# Patient Record
Sex: Male | Born: 1985 | Race: Black or African American | Hispanic: No | Marital: Single | Smoking: Former smoker
Health system: Southern US, Community
[De-identification: ages and names within clinical notes are randomized; demographics above are authoritative.]

---

## 2007-06-21 ENCOUNTER — Emergency Department: Payer: Self-pay | Admitting: Internal Medicine

## 2014-12-25 ENCOUNTER — Emergency Department (HOSPITAL_COMMUNITY)
Admission: EM | Admit: 2014-12-25 | Discharge: 2014-12-25 | Disposition: A | Discharge status: Home / Self Care | Payer: 59 | Attending: Emergency Medicine | Admitting: Emergency Medicine

## 2014-12-25 ENCOUNTER — Emergency Department (HOSPITAL_COMMUNITY): Payer: 59

## 2014-12-25 ENCOUNTER — Encounter (HOSPITAL_COMMUNITY): Payer: Self-pay | Admitting: *Deleted

## 2014-12-25 DIAGNOSIS — X58XXXA Exposure to other specified factors, initial encounter: Secondary | ICD-10-CM | POA: Insufficient documentation

## 2014-12-25 DIAGNOSIS — Y929 Unspecified place or not applicable: Secondary | ICD-10-CM | POA: Insufficient documentation

## 2014-12-25 DIAGNOSIS — Y998 Other external cause status: Secondary | ICD-10-CM | POA: Diagnosis not present

## 2014-12-25 DIAGNOSIS — Y939 Activity, unspecified: Secondary | ICD-10-CM | POA: Insufficient documentation

## 2014-12-25 DIAGNOSIS — M79671 Pain in right foot: Secondary | ICD-10-CM

## 2014-12-25 DIAGNOSIS — S99921A Unspecified injury of right foot, initial encounter: Secondary | ICD-10-CM | POA: Diagnosis not present

## 2014-12-25 DIAGNOSIS — Z720 Tobacco use: Secondary | ICD-10-CM | POA: Insufficient documentation

## 2014-12-25 MED ORDER — IBUPROFEN 800 MG PO TABS: 800.0000 mg | ORAL_TABLET | Freq: Three times a day (TID) | ORAL | Status: DC

## 2014-12-25 NOTE — ED Provider Notes (Signed)
CSN: [id number]     Arrival date & time 12/25/14  0848 History  This chart was scribed for non-physician practitioner, Santiago Glad, PA-C, working with Richardean Canal, MD, by Ronney Lion, ED Scribe. This patient was seen in room TR08C/TR08C and the patient's care was started at 9:39 AM.    Chief Complaint  Patient presents with  . Foot Pain   The history is provided by the patient. No language interpreter was used.    HPI Comments: Jeremy Curtis is a 29 y.o. male who presents to the Emergency Department complaining of right foot pain since twisting his foot on a rock this morning at 3 AM, about 6 hours ago. He endorses numbness over some of the foot. Patient states he is able to ambulate with a limp. Nothing makes it better. Weight bearing makes it worse. He denies any other injuries. He denies ankle pain.  History reviewed. No pertinent past medical history. History reviewed. No pertinent past surgical history. History reviewed. No pertinent family history. History  Substance Use Topics  . Smoking status: Current Every Day Smoker    Types: Cigarettes  . Smokeless tobacco: Never Used  . Alcohol Use: No    Review of Systems  Musculoskeletal: Positive for myalgias. Negative for arthralgias.      Allergies  Review of patient's allergies indicates not on file.  Home Medications   Prior to Admission medications   Not on File   BP 138/82 mmHg  Pulse 93  Temp(Src) 98.3 F (36.8 C) (Oral)  Resp 18  SpO2 100% Physical Exam  Constitutional: He is oriented to person, place, and time. He appears well-developed and well-nourished. No distress.  HENT:  Head: Normocephalic and atraumatic.  Eyes: Conjunctivae and EOM are normal.  Neck: Neck supple. No tracheal deviation present.  Cardiovascular: Normal rate and regular rhythm.   Pulses:      Dorsalis pedis pulses are 2+ on the right side.  Pulmonary/Chest: Effort normal and breath sounds normal. No respiratory distress.   Musculoskeletal: Normal range of motion. He exhibits tenderness.  TTP to the third metatarsal. No TTP of the right ankle. FROM of the right ankle. Patient able to wiggle his toes.   Neurological: He is alert and oriented to person, place, and time.  Distal sensation of toes intact.  Skin: Skin is warm and dry.  No bruising or erythema of the foot.  Psychiatric: He has a normal mood and affect. His behavior is normal.  Nursing note and vitals reviewed.   ED Course  Procedures (including critical care time)  DIAGNOSTIC STUDIES: Oxygen Saturation is 100% on RA, normal by my interpretation.    COORDINATION OF CARE: 9:41 AM - Pt made aware of XR results. Suspect foot sprain. Discussed treatment plan with pt at bedside which includes RICE protocol, ibuprofen, post-op shoe, and crutches, and pt agreed to plan.  Imaging Review Dg Foot Complete Right  12/25/2014   CLINICAL DATA:  Inversion injury of the right foot with pain medially over the first and second metatarsal regions.  EXAM: RIGHT FOOT COMPLETE - 3+ VIEW  COMPARISON:  None.  FINDINGS: The bones of the right foot are adequately mineralized. There is no acute fracture nor dislocation. The joint spaces are preserved. Specific attention to the first and second metatarsals reveals no acute abnormality. The soft tissues are unremarkable.  IMPRESSION: There is no acute bony abnormality of the right foot.   Electronically Signed   By: David  [place] M.D.  On: 12/25/2014 09:26    MDM   Final diagnoses:  None  Patient presents today with right foot pain after twisting his foot this morning.  No ankle pain.  Xray of foot is negative.  Patient neurovascularly intact.  Stable for discharge.  Return precautions given.  I personally performed the services described in this documentation, which was scribed in my presence. The recorded information has been reviewed and is accurate.     Santiago GladHeather Shawnee Higham, PA-C 12/25/14 1319  Richardean Canalavid H Yao,  MD 12/25/14 [id number]

## 2014-12-25 NOTE — ED Notes (Signed)
Pt reports he turned his rt foot on a rock early this AM. Pt now reports pain to top of foot.

## 2016-04-04 IMAGING — DX DG FOOT COMPLETE 3+V*R*
3 series · 3 of 3 positions shown · non-contrast
Comparison: None.

CLINICAL DATA: Inversion injury of the right foot with pain
medially over the first and second metatarsal regions.

EXAM:
RIGHT FOOT COMPLETE - 3+ VIEW

[foot ap]
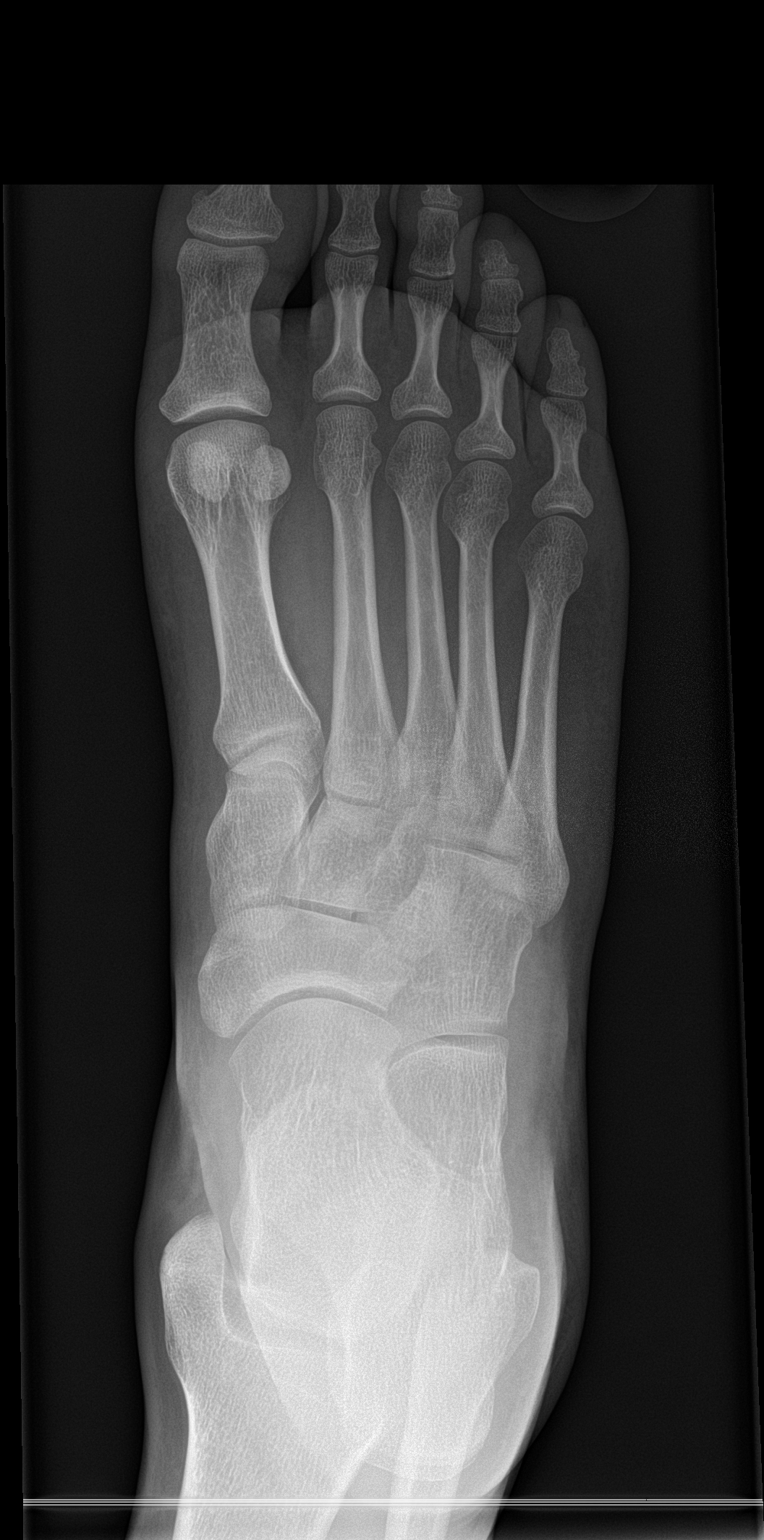

[foot obl]
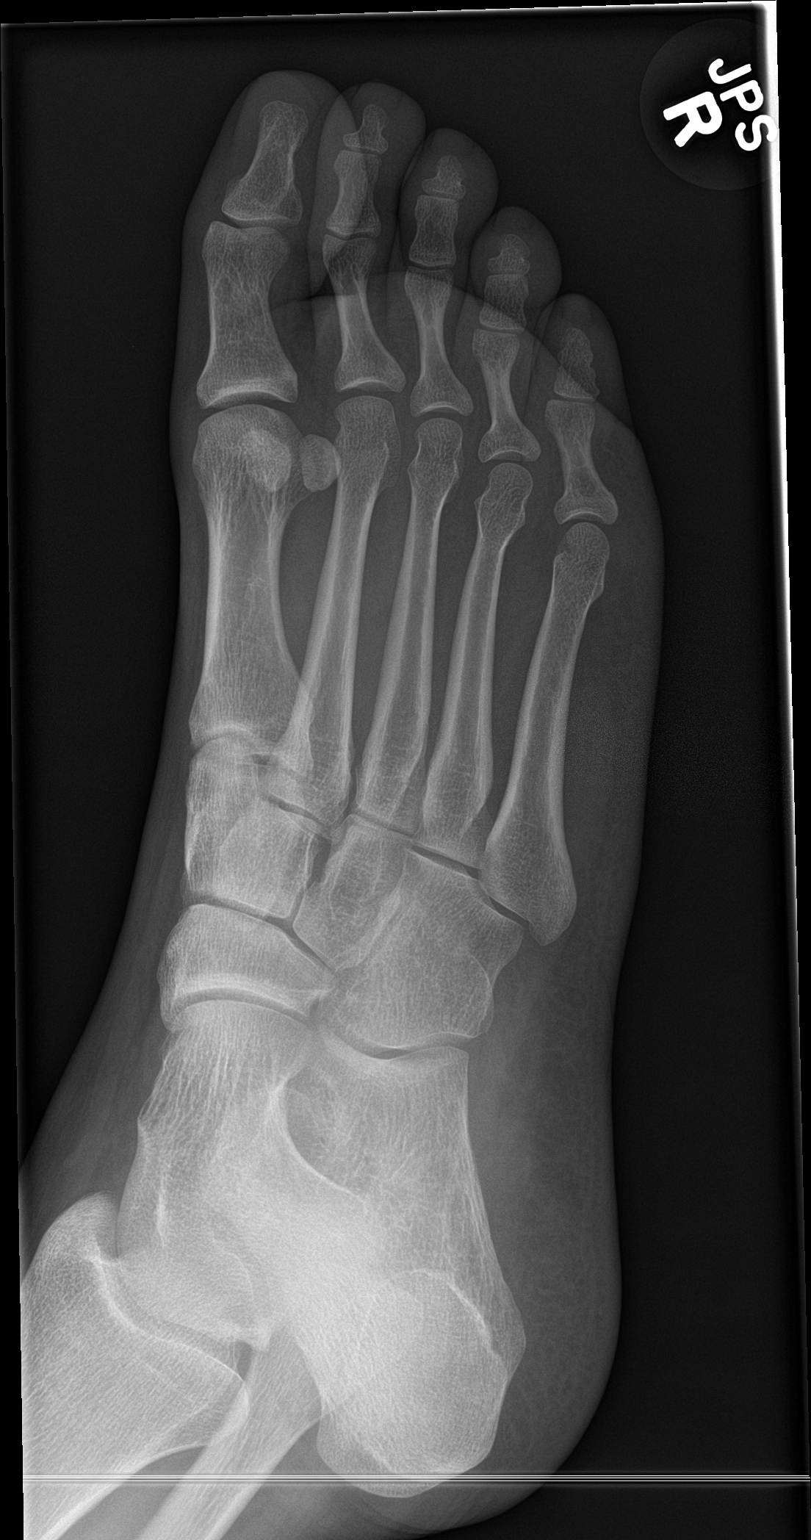

[foot lat]
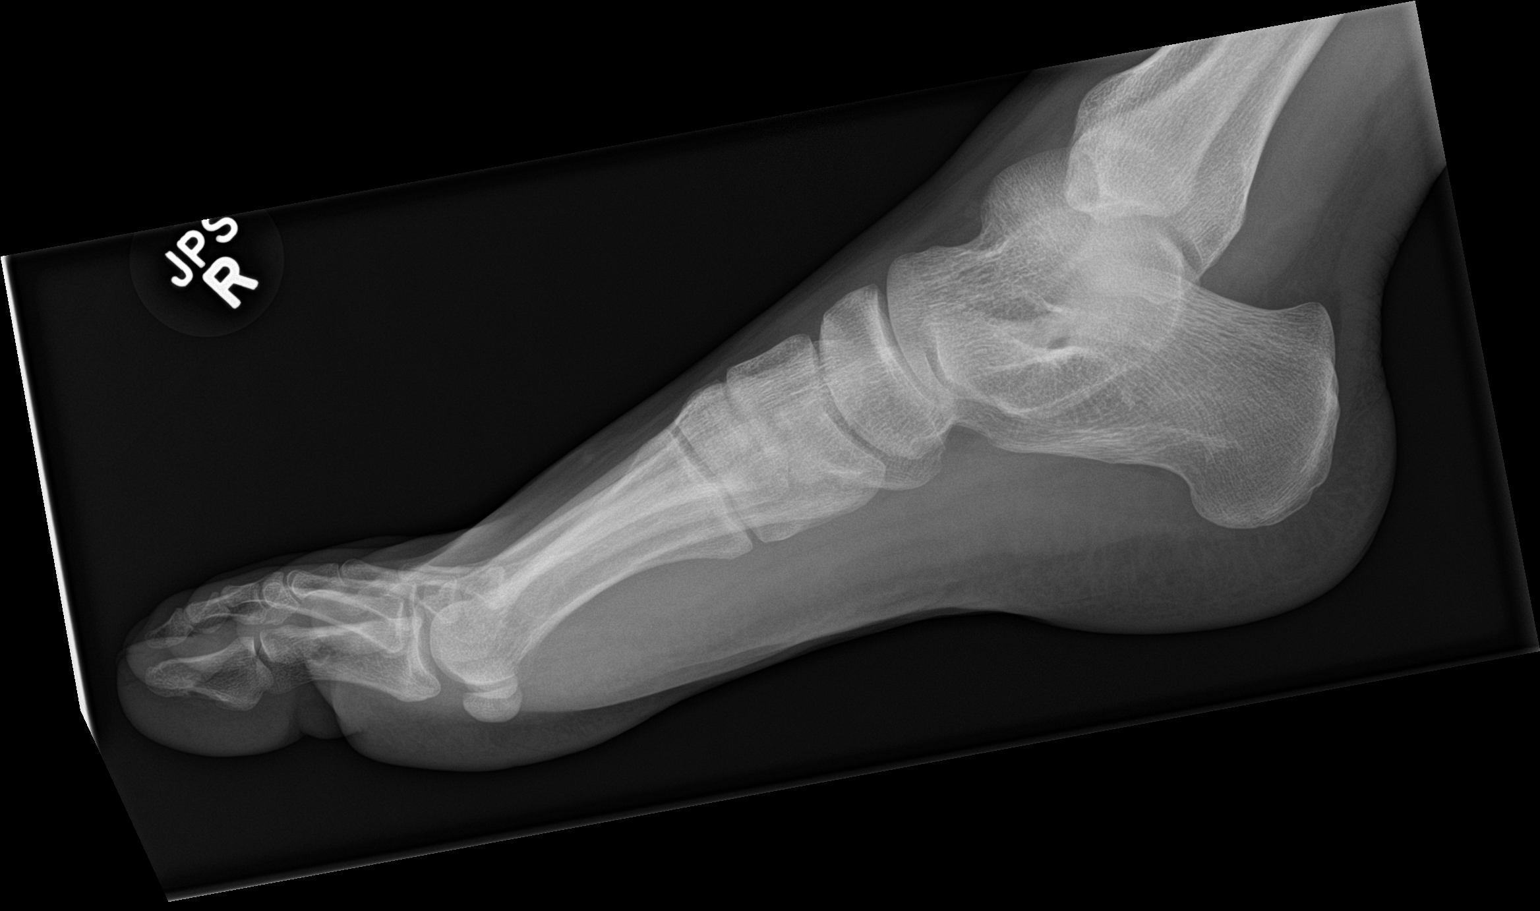

[3 of 3 positions shown; findings below may reference images not displayed]

FINDINGS: The bones of the right foot are adequately mineralized. There is no
acute fracture nor dislocation. The joint spaces are preserved.
Specific attention to the first and second metatarsals reveals no
acute abnormality. The soft tissues are unremarkable.
IMPRESSION: There is no acute bony abnormality of the right foot.

## 2017-03-09 ENCOUNTER — Ambulatory Visit (INDEPENDENT_AMBULATORY_CARE_PROVIDER_SITE_OTHER): Payer: Self-pay | Admitting: Orthopaedic Surgery

## 2018-03-19 ENCOUNTER — Other Ambulatory Visit
Admission: RE | Admit: 2018-03-19 | Discharge: 2018-03-19 | Disposition: A | Discharge status: Home / Self Care | Payer: 59 | Source: Ambulatory Visit | Attending: Pediatrics | Admitting: Pediatrics

## 2018-03-19 DIAGNOSIS — R079 Chest pain, unspecified: Secondary | ICD-10-CM | POA: Diagnosis not present

## 2018-03-19 LAB — TROPONIN I: Troponin I: <0.03 ng/mL (ref <0.03)

## 2019-06-24 ENCOUNTER — Other Ambulatory Visit: Payer: Self-pay

## 2019-06-24 DIAGNOSIS — Z20822 Contact with and (suspected) exposure to covid-19: Secondary | ICD-10-CM

## 2019-06-27 LAB — NOVEL CORONAVIRUS, NAA: SARS-CoV-2, NAA: NOT DETECTED

## 2020-01-27 ENCOUNTER — Telehealth: Payer: Self-pay | Admitting: General Practice

## 2020-01-27 NOTE — Telephone Encounter (Signed)
Attempted to call patient and schedule new patient appointment. LVM to call back and schedule.

## 2020-02-23 ENCOUNTER — Telehealth: Payer: Self-pay | Admitting: Internal Medicine

## 2020-02-23 NOTE — Telephone Encounter (Signed)
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 Please call patient on [phone] to let him know if he would be able to get form signed or not before establishing.

## 2020-03-22 ENCOUNTER — Ambulatory Visit: Payer: 59 | Admitting: Internal Medicine

## 2020-03-22 DIAGNOSIS — Z0289 Encounter for other administrative examinations: Secondary | ICD-10-CM

## 2020-10-01 LAB — COMPREHENSIVE METABOLIC PANEL
Albumin: 4.7 (ref 3.5–5.0)
Calcium: 9.8 (ref 8.7–10.7)
Globulin: 2.5
eGFR: 96

## 2020-10-01 LAB — HEPATIC FUNCTION PANEL
ALT: 21 U/L (ref 10–40)
AST: 19 (ref 14–40)
Alkaline Phosphatase: 81 (ref 25–125)
Bilirubin, Total: 0.1

## 2020-10-01 LAB — LIPID PANEL
Cholesterol: 149 (ref 0–200)
HDL: 51 (ref 35–70)
LDL Cholesterol: 84
Triglycerides: 69 (ref 40–160)

## 2020-10-01 LAB — CBC AND DIFFERENTIAL
HCT: 45 (ref 41–53)
Hemoglobin: 15.1 (ref 13.5–17.5)
Neutrophils Absolute: 3
Platelets: 317 10*3/uL (ref 150–400)
WBC: 6.5

## 2020-10-01 LAB — BASIC METABOLIC PANEL
BUN: 12 (ref 4–21)
CO2: 25 — AB (ref 13–22)
Chloride: 102 (ref 99–108)
Creatinine: 1.1 (ref 0.6–1.3)
Glucose: 92
Potassium: 4.1 mEq/L (ref 3.5–5.1)
Sodium: 143 (ref 137–147)

## 2020-10-01 LAB — CBC: RBC: 4.85 (ref 3.87–5.11)

## 2020-10-01 LAB — HEMOGLOBIN A1C: Hemoglobin A1C: 5.7

## 2020-10-01 LAB — VITAMIN D 25 HYDROXY (VIT D DEFICIENCY, FRACTURES): Vit D, 25-Hydroxy: 29.3

## 2022-09-22 ENCOUNTER — Encounter: Payer: Self-pay | Admitting: Family

## 2022-09-22 ENCOUNTER — Ambulatory Visit (INDEPENDENT_AMBULATORY_CARE_PROVIDER_SITE_OTHER): Payer: 59 | Admitting: Family

## 2022-09-22 VITALS — BP 132/70 | HR 87 | Ht 66.0 in | Wt 207.0 lb

## 2022-09-22 DIAGNOSIS — E782 Mixed hyperlipidemia: Secondary | ICD-10-CM | POA: Diagnosis not present

## 2022-09-22 DIAGNOSIS — E538 Deficiency of other specified B group vitamins: Secondary | ICD-10-CM

## 2022-09-22 DIAGNOSIS — A599 Trichomoniasis, unspecified: Secondary | ICD-10-CM

## 2022-09-22 DIAGNOSIS — Z0001 Encounter for general adult medical examination with abnormal findings: Secondary | ICD-10-CM | POA: Diagnosis not present

## 2022-09-22 DIAGNOSIS — Z1159 Encounter for screening for other viral diseases: Secondary | ICD-10-CM

## 2022-09-22 DIAGNOSIS — R7303 Prediabetes: Secondary | ICD-10-CM

## 2022-09-22 DIAGNOSIS — E559 Vitamin D deficiency, unspecified: Secondary | ICD-10-CM

## 2022-09-22 DIAGNOSIS — Z202 Contact with and (suspected) exposure to infections with a predominantly sexual mode of transmission: Secondary | ICD-10-CM

## 2022-09-22 DIAGNOSIS — Z114 Encounter for screening for human immunodeficiency virus [HIV]: Secondary | ICD-10-CM

## 2022-09-22 DIAGNOSIS — R5383 Other fatigue: Secondary | ICD-10-CM

## 2022-09-22 DIAGNOSIS — Z6833 Body mass index (BMI) 33.0-33.9, adult: Secondary | ICD-10-CM

## 2022-09-22 DIAGNOSIS — Z0000 Encounter for general adult medical examination without abnormal findings: Secondary | ICD-10-CM

## 2022-09-22 LAB — POCT URINALYSIS DIPSTICK
Bilirubin, UA: NEGATIVE
Glucose, UA: NEGATIVE
Ketones, UA: NEGATIVE
Leukocytes, UA: NEGATIVE
Nitrite, UA: NEGATIVE
Protein, UA: NEGATIVE
Spec Grav, UA: 1.02 (ref 1.010–1.025)
Urobilinogen, UA: 0.2 E.U./dL
pH, UA: 5.5 (ref 5.0–8.0)

## 2022-09-22 NOTE — Progress Notes (Signed)
Complete physical exam  Patient: Jeremy Curtis   DOB: 02-Jul-1986   37 y.o. Male  MRN: [id number]  Subjective:    Chief Complaint  Patient presents with   Annual Exam    CPE    Jeremy Curtis is a 37 y.o. male who presents today for a complete physical exam. He reports consuming a general diet, reports that he is trying to stop overeating. The patient has a physically strenuous job, but has no regular exercise apart from work.  He generally feels well. He reports sleeping well. He does not have additional problems to discuss today.    Most recent fall risk assessment:    09/22/2022   11:24 AM  [place] in the past year? 0  Number falls in past yr: 0  Injury with Fall? 0  Follow up Falls evaluation completed     Most recent depression screenings:    09/22/2022   11:24 AM  PHQ 2/9 Scores  PHQ - 2 Score 0    There are no problems to display for this patient.  No past medical history on file. No past surgical history on file. Social History   Tobacco Use   Smoking status: Former    Types: Cigarettes   Smokeless tobacco: Never  Vaping Use   Vaping Use: Every day   Devices: fbar  Substance Use Topics   Alcohol use: No   Drug use: No   No family history on file. No Known Allergies    Patient Care Team: Mechele Claude, FNP as PCP - General (Family Medicine)   Outpatient Medications Prior to Visit  Medication Sig   rosuvastatin (CRESTOR) 10 MG tablet Take 10 mg by mouth daily.   sildenafil (VIAGRA) 25 MG tablet Take 25 mg by mouth daily as needed for erectile dysfunction.   [DISCONTINUED] ibuprofen (ADVIL,MOTRIN) 800 MG tablet Take 1 tablet (800 mg total) by mouth 3 (three) times daily.   No facility-administered medications prior to visit.    Review of Systems  All other systems reviewed and are negative.         Objective:     BP 132/70   Pulse 87   Ht 5' 6"$  (1.676 m)   Wt 207 lb (93.9 kg)   SpO2 97%   BMI 33.41 kg/m    Physical Exam Vitals and nursing note reviewed.  Constitutional:      General: He is awake. He is not in acute distress.    Appearance: Normal appearance. He is well-developed and overweight. He is not ill-appearing.  HENT:     Head: Normocephalic.     Nose: Nose normal.  Eyes:     Extraocular Movements: Extraocular movements intact.     Conjunctiva/sclera: Conjunctivae normal.     Pupils: Pupils are equal, round, and reactive to light.  Cardiovascular:     Rate and Rhythm: Normal rate and regular rhythm.     Pulses: Normal pulses.     Heart sounds: Normal heart sounds.  Pulmonary:     Effort: Pulmonary effort is normal.     Breath sounds: Normal breath sounds.  Abdominal:     General: Abdomen is flat.  Musculoskeletal:        General: Normal range of motion.  Skin:    General: Skin is warm and dry.  Neurological:     General: No focal deficit present.     Mental Status: He is alert and oriented to person, place, and  time.  Psychiatric:        Mood and Affect: Mood normal.        Behavior: Behavior normal. Behavior is cooperative.        Thought Content: Thought content normal.        Judgment: Judgment normal.      Results for orders placed or performed in visit on 09/22/22  POCT Urinalysis Dipstick (81002)  Result Value Ref Range   Color, UA     Clarity, UA     Glucose, UA Negative Negative   Bilirubin, UA Negative    Ketones, UA Negative    Spec Grav, UA 1.020 1.010 - 1.025   Blood, UA Trace    pH, UA 5.5 5.0 - 8.0   Protein, UA Negative Negative   Urobilinogen, UA 0.2 0.2 or 1.0 E.U./dL   Nitrite, UA Negative    Leukocytes, UA Negative Negative   Appearance     Odor        Assessment & Plan:    Routine Health Maintenance and Physical Exam   There is no immunization history on file for this patient.  Health Maintenance  Topic Date Due   COVID-19 Vaccine (1) Never done   Hepatitis C Screening  Never done   DTaP/Tdap/Td (1 - Tdap) Never done    INFLUENZA VACCINE  04/23/2023 (Originally 03/04/2022)   HIV Screening  Completed   HPV VACCINES  Aged Out    Discussed health benefits of physical activity, and encouraged him to engage in regular exercise appropriate for his age and condition.  Problem List Items Addressed This Visit   None Visit Diagnoses     Encounter for annual physical exam    -  Primary   Relevant Orders   POCT Urinalysis Dipstick (81002) (Completed)   Prediabetes       Relevant Orders   Hemoglobin A1c   Mixed hyperlipidemia       Relevant Medications   sildenafil (VIAGRA) 25 MG tablet   rosuvastatin (CRESTOR) 10 MG tablet   Other Relevant Orders   Lipid panel   B12 deficiency due to diet       Vitamin D deficiency, unspecified       Other fatigue       Relevant Orders   CBC with Differential   Comprehensive metabolic panel   Encounter for screening for other viral diseases       Relevant Orders   Hepatitis C antibody screen   Screening for HIV (human immunodeficiency virus)       Relevant Orders   HIV antibody   Routine general medical examination at a health care facility       Contact with and (suspected) exposure to infections with a predominantly sexual mode of transmission       Relevant Orders   Chlamydia/Gonococcus/Trichomonas, NAA   Body mass index (BMI) of 33.0-33.9 in adult          Return in 3 months (on 12/21/2022).   Mechele Claude, FNP  09/22/2022

## 2022-09-22 NOTE — Patient Instructions (Signed)
Health Maintenance, Male Adopting a healthy lifestyle and getting preventive care are important in promoting health and wellness. Ask your health care provider about: The right schedule for you to have regular tests and exams. Things you can do on your own to prevent diseases and keep yourself healthy. What should I know about diet, weight, and exercise? Eat a healthy diet  Eat a diet that includes plenty of vegetables, fruits, low-fat dairy products, and lean protein. Do not eat a lot of foods that are high in solid fats, added sugars, or sodium. Maintain a healthy weight Body mass index (BMI) is a measurement that can be used to identify possible weight problems. It estimates body fat based on height and weight. Your health care provider can help determine your BMI and help you achieve or maintain a healthy weight. Get regular exercise Get regular exercise. This is one of the most important things you can do for your health. Most adults should: Exercise for at least 150 minutes each week. The exercise should increase your heart rate and make you sweat (moderate-intensity exercise). Do strengthening exercises at least twice a week. This is in addition to the moderate-intensity exercise. Spend less time sitting. Even light physical activity can be beneficial. Watch cholesterol and blood lipids Have your blood tested for lipids and cholesterol at 37 years of age, then have this test every 5 years. You may need to have your cholesterol levels checked more often if: Your lipid or cholesterol levels are high. You are older than 37 years of age. You are at high risk for heart disease. What should I know about cancer screening? Many types of cancers can be detected early and may often be prevented. Depending on your health history and family history, you may need to have cancer screening at various ages. This may include screening for: Colorectal cancer. Prostate cancer. Skin cancer. Lung  cancer. What should I know about heart disease, diabetes, and high blood pressure? Blood pressure and heart disease High blood pressure causes heart disease and increases the risk of stroke. This is more likely to develop in people who have high blood pressure readings or are overweight. Talk with your health care provider about your target blood pressure readings. Have your blood pressure checked: Every 3-5 years if you are 18-39 years of age. Every year if you are 40 years old or older. If you are between the ages of 65 and 75 and are a current or former smoker, ask your health care provider if you should have a one-time screening for abdominal aortic aneurysm (AAA). Diabetes Have regular diabetes screenings. This checks your fasting blood sugar level. Have the screening done: Once every three years after age 45 if you are at a normal weight and have a low risk for diabetes. More often and at a younger age if you are overweight or have a high risk for diabetes. What should I know about preventing infection? Hepatitis B If you have a higher risk for hepatitis B, you should be screened for this virus. Talk with your health care provider to find out if you are at risk for hepatitis B infection. Hepatitis C Blood testing is recommended for: Everyone born from 1945 through 1965. Anyone with known risk factors for hepatitis C. Sexually transmitted infections (STIs) You should be screened each year for STIs, including gonorrhea and chlamydia, if: You are sexually active and are younger than 37 years of age. You are older than 37 years of age and your   health care provider tells you that you are at risk for this type of infection. Your sexual activity has changed since you were last screened, and you are at increased risk for chlamydia or gonorrhea. Ask your health care provider if you are at risk. Ask your health care provider about whether you are at high risk for HIV. Your health care provider  may recommend a prescription medicine to help prevent HIV infection. If you choose to take medicine to prevent HIV, you should first get tested for HIV. You should then be tested every 3 months for as long as you are taking the medicine. Follow these instructions at home: Alcohol use Do not drink alcohol if your health care provider tells you not to drink. If you drink alcohol: Limit how much you have to 0-2 drinks a day. Know how much alcohol is in your drink. In the [place] one drink equals one 12 oz bottle of beer (355 mL), one 5 oz glass of wine (148 mL), or one 1 oz glass of hard liquor (44 mL). Lifestyle Do not use any products that contain nicotine or tobacco. These products include cigarettes, chewing tobacco, and vaping devices, such as e-cigarettes. If you need help quitting, ask your health care provider. Do not use street drugs. Do not share needles. Ask your health care provider for help if you need support or information about quitting drugs. General instructions Schedule regular health, dental, and eye exams. Stay current with your vaccines. Tell your health care provider if: You often feel depressed. You have ever been abused or do not feel safe at home. Summary Adopting a healthy lifestyle and getting preventive care are important in promoting health and wellness. Follow your health care provider's instructions about healthy diet, exercising, and getting tested or screened for diseases. Follow your health care provider's instructions on monitoring your cholesterol and blood pressure. This information is not intended to replace advice given to you by your health care provider. Make sure you discuss any questions you have with your health care provider. Document Revised: 12/10/2020 Document Reviewed: 12/10/2020 Elsevier Patient Education  2023 [employer]  

## 2022-09-23 LAB — LIPID PANEL
Chol/HDL Ratio: 5.3 ratio — ABNORMAL HIGH (ref 0.0–5.0)
Cholesterol, Total: 249 mg/dL — ABNORMAL HIGH (ref 100–199)
HDL: 47 mg/dL (ref >39)
LDL Chol Calc (NIH): 184 mg/dL — ABNORMAL HIGH (ref 0–99)
Triglycerides: 104 mg/dL (ref 0–149)
VLDL Cholesterol Cal: 18 mg/dL (ref 5–40)

## 2022-09-23 LAB — COMPREHENSIVE METABOLIC PANEL
ALT: 24 IU/L (ref 0–44)
AST: 24 IU/L (ref 0–40)
Albumin/Globulin Ratio: 1.2 (ref 1.2–2.2)
Albumin: 4.4 g/dL (ref 4.1–5.1)
Alkaline Phosphatase: 84 IU/L (ref 44–121)
BUN/Creatinine Ratio: 12 (ref 9–20)
BUN: 12 mg/dL (ref 6–20)
Bilirubin Total: 0.3 mg/dL (ref 0.0–1.2)
CO2: 24 mmol/L (ref 20–29)
Calcium: 9.9 mg/dL (ref 8.7–10.2)
Chloride: 102 mmol/L (ref 96–106)
Creatinine, Ser: 0.98 mg/dL (ref 0.76–1.27)
Globulin, Total: 3.7 g/dL (ref 1.5–4.5)
Glucose: 93 mg/dL (ref 70–99)
Potassium: 4.1 mmol/L (ref 3.5–5.2)
Sodium: 141 mmol/L (ref 134–144)
Total Protein: 8.1 g/dL (ref 6.0–8.5)
eGFR: 102 mL/min/{1.73_m2} (ref >59)

## 2022-09-23 LAB — CBC WITH DIFFERENTIAL/PLATELET
Basophils Absolute: 0.1 10*3/uL (ref 0.0–0.2)
Basos: 1 %
EOS (ABSOLUTE): 0.2 10*3/uL (ref 0.0–0.4)
Eos: 3 %
Hematocrit: 40.6 % (ref 37.5–51.0)
Hemoglobin: 13.4 g/dL (ref 13.0–17.7)
Immature Grans (Abs): 0 10*3/uL (ref 0.0–0.1)
Immature Granulocytes: 0 %
Lymphocytes Absolute: 3.3 10*3/uL — ABNORMAL HIGH (ref 0.7–3.1)
Lymphs: 49 %
MCH: 30.6 pg (ref 26.6–33.0)
MCHC: 33 g/dL (ref 31.5–35.7)
MCV: 93 fL (ref 79–97)
Monocytes Absolute: 0.4 10*3/uL (ref 0.1–0.9)
Monocytes: 6 %
Neutrophils Absolute: 2.7 10*3/uL (ref 1.4–7.0)
Neutrophils: 41 %
Platelets: 432 10*3/uL (ref 150–450)
RBC: 4.38 x10E6/uL (ref 4.14–5.80)
RDW: 13.9 % (ref 11.6–15.4)
WBC: 6.7 10*3/uL (ref 3.4–10.8)

## 2022-09-23 LAB — HEMOGLOBIN A1C
Est. average glucose Bld gHb Est-mCnc: 128 mg/dL
Hgb A1c MFr Bld: 6.1 % — ABNORMAL HIGH (ref 4.8–5.6)

## 2022-09-23 LAB — HIV ANTIBODY (ROUTINE TESTING W REFLEX): HIV Screen 4th Generation wRfx: NONREACTIVE

## 2022-09-23 LAB — HEPATITIS C ANTIBODY: Hep C Virus Ab: NONREACTIVE

## 2022-09-24 ENCOUNTER — Encounter: Payer: Self-pay | Admitting: Family

## 2022-09-25 ENCOUNTER — Encounter: Payer: Self-pay | Admitting: Family

## 2022-09-25 LAB — CHLAMYDIA/GONOCOCCUS/TRICHOMONAS, NAA
Chlamydia by NAA: NEGATIVE
Gonococcus by NAA: NEGATIVE
Trich vag by NAA: POSITIVE — AB

## 2022-09-25 MED ORDER — METRONIDAZOLE 500 MG PO TABS: 2000.0000 mg | ORAL_TABLET | Freq: Once | ORAL | 0 refills | Status: AC

## 2022-09-25 NOTE — Addendum Note (Signed)
Addended by: Georgian Co on: 09/25/2022 09:17 AM   Modules accepted: Orders

## 2022-10-26 ENCOUNTER — Other Ambulatory Visit: Payer: Self-pay | Admitting: Family

## 2022-12-18 ENCOUNTER — Encounter: Payer: Self-pay | Admitting: Family

## 2022-12-18 ENCOUNTER — Ambulatory Visit: Payer: 59 | Admitting: Family

## 2022-12-18 VITALS — BP 140/80 | HR 127 | Temp 101.1°F | Ht 67.0 in | Wt 210.6 lb

## 2022-12-18 DIAGNOSIS — J029 Acute pharyngitis, unspecified: Secondary | ICD-10-CM

## 2022-12-18 DIAGNOSIS — R7303 Prediabetes: Secondary | ICD-10-CM | POA: Diagnosis not present

## 2022-12-18 DIAGNOSIS — E782 Mixed hyperlipidemia: Secondary | ICD-10-CM | POA: Diagnosis not present

## 2022-12-18 DIAGNOSIS — E559 Vitamin D deficiency, unspecified: Secondary | ICD-10-CM

## 2022-12-18 DIAGNOSIS — Z20822 Contact with and (suspected) exposure to covid-19: Secondary | ICD-10-CM | POA: Diagnosis not present

## 2022-12-18 HISTORY — DX: Prediabetes: R73.03

## 2022-12-18 HISTORY — DX: Mixed hyperlipidemia: E78.2

## 2022-12-18 HISTORY — DX: Vitamin D deficiency, unspecified: E55.9

## 2022-12-18 LAB — POCT XPERT XPRESS SARS COVID-2/FLU/RSV
FLU A: NEGATIVE
FLU B: NEGATIVE
RSV RNA, PCR: NEGATIVE
SARS Coronavirus 2: NEGATIVE

## 2022-12-18 LAB — POCT RAPID STREP A (OFFICE): Rapid Strep A Screen: NEGATIVE

## 2022-12-18 NOTE — Progress Notes (Signed)
Established Patient Office Visit  Subjective:  Patient ID: Jeremy Curtis, male    DOB: 1986-04-25  Age: 37 y.o. MRN: [id number]  Chief Complaint  Patient presents with   Acute Visit    Body aches/chills/sore throat -started last night. To take home covid test and send results through mychart.   URI    URI  This is a new problem. The current episode started yesterday. The problem has been rapidly worsening. The maximum temperature recorded prior to his arrival was 101 - 101.9 F. The fever has been present for 1 to 2 days. Associated symptoms include congestion, headaches and a sore throat. Pertinent negatives include no coughing. He has tried antihistamine and sleep for the symptoms. The treatment provided mild relief.    No other concerns at this time.   History reviewed. No pertinent past medical history.  History reviewed. No pertinent surgical history.  Social History   Socioeconomic History   Marital status: Single    Spouse name: Not on file   Number of children: Not on file   Years of education: Not on file   Highest education level: Not on file  Occupational History   Not on file  Tobacco Use   Smoking status: Former    Types: Cigarettes   Smokeless tobacco: Never  Vaping Use   Vaping Use: Every day   Devices: fbar  Substance and Sexual Activity   Alcohol use: No   Drug use: No   Sexual activity: Not on file  Other Topics Concern   Not on file  Social History Narrative   Not on file   Social Determinants of Health   Financial Resource Strain: Not on file  Food Insecurity: Not on file  Transportation Needs: Not on file  Physical Activity: Not on file  Stress: Not on file  Social Connections: Not on file  Intimate Partner Violence: Not on file    Family History  Problem Relation Age of Onset   Hypertension Mother    Arthritis Mother     No Known Allergies  Review of Systems  Constitutional:  Positive for chills, fever and  malaise/fatigue.  HENT:  Positive for congestion and sore throat.   Respiratory:  Negative for cough.   Musculoskeletal:  Positive for myalgias.  Neurological:  Positive for headaches.  All other systems reviewed and are negative.      Objective:   BP (!) 140/80   Pulse (!) 127   Temp (!) 101.1 F (38.4 C) (Tympanic)   Ht 5\' 7"  (1.702 m)   Wt 210 lb 9.6 oz (95.5 kg)   PF 97 L/min   BMI 32.98 kg/m   Vitals:   12/18/22 1451  BP: (!) 140/80  Pulse: (!) 127  Temp: (!) 101.1 F (38.4 C)  Height: 5\' 7"  (1.702 m)  Weight: 210 lb 9.6 oz (95.5 kg)  PF: 97 L/min  TempSrc: Tympanic  BMI (Calculated): 32.98    Physical Exam Vitals and nursing note reviewed.  Constitutional:      General: He is not in acute distress.    Appearance: Normal appearance. He is obese.  HENT:     Nose: Congestion and rhinorrhea present.     Mouth/Throat:     Pharynx: Posterior oropharyngeal erythema present.  Eyes:     Extraocular Movements: Extraocular movements intact.     Conjunctiva/sclera: Conjunctivae normal.     Pupils: Pupils are equal, round, and reactive to light.  Neurological:     General:  No focal deficit present.     Mental Status: He is alert and oriented to person, place, and time. Mental status is at baseline.      Results for orders placed or performed in visit on 12/18/22  POCT rapid strep A  Result Value Ref Range   Rapid Strep A Screen Negative Negative  POCT XPERT XPRESS SARS COVID-2/FLU/RSV  Result Value Ref Range   SARS Coronavirus 2 negative    FLU A negative    FLU B negative    RSV RNA, PCR negative     Recent Results (from the past 2160 hour(s))  POCT Urinalysis Dipstick (82956)     Status: Abnormal   Collection Time: 09/22/22 11:03 AM  Result Value Ref Range   Color, UA     Clarity, UA     Glucose, UA Negative Negative   Bilirubin, UA Negative    Ketones, UA Negative    Spec Grav, UA 1.020 1.010 - 1.025   Blood, UA Trace    pH, UA 5.5 5.0 - 8.0    Protein, UA Negative Negative   Urobilinogen, UA 0.2 0.2 or 1.0 E.U./dL   Nitrite, UA Negative    Leukocytes, UA Negative Negative   Appearance     Odor    CBC with Differential     Status: Abnormal   Collection Time: 09/22/22 11:36 AM  Result Value Ref Range   WBC 6.7 3.4 - 10.8 x10E3/uL   RBC 4.38 4.14 - 5.80 x10E6/uL   Hemoglobin 13.4 13.0 - 17.7 g/dL   Hematocrit 21.3 08.6 - 51.0 %   MCV 93 79 - 97 fL   MCH 30.6 26.6 - 33.0 pg   MCHC 33.0 31.5 - 35.7 g/dL   RDW 57.8 46.9 - 62.9 %   Platelets 432 150 - 450 x10E3/uL   Neutrophils 41 Not Estab. %   Lymphs 49 Not Estab. %   Monocytes 6 Not Estab. %   Eos 3 Not Estab. %   Basos 1 Not Estab. %   Neutrophils Absolute 2.7 1.4 - 7.0 x10E3/uL   Lymphocytes Absolute 3.3 (H) 0.7 - 3.1 x10E3/uL   Monocytes Absolute 0.4 0.1 - 0.9 x10E3/uL   EOS (ABSOLUTE) 0.2 0.0 - 0.4 x10E3/uL   Basophils Absolute 0.1 0.0 - 0.2 x10E3/uL   Immature Granulocytes 0 Not Estab. %   Immature Grans (Abs) 0.0 0.0 - 0.1 x10E3/uL  Comprehensive metabolic panel     Status: None   Collection Time: 09/22/22 11:36 AM  Result Value Ref Range   Glucose 93 70 - 99 mg/dL   BUN 12 6 - 20 mg/dL   Creatinine, Ser 5.28 0.76 - 1.27 mg/dL   eGFR 413 >24 MW/NUU/7.25   BUN/Creatinine Ratio 12 9 - 20   Sodium 141 134 - 144 mmol/L   Potassium 4.1 3.5 - 5.2 mmol/L    Comment: Specimen received hemolyzed. Value may be increased by hemolysis. Clinical correlation indicated.    Chloride 102 96 - 106 mmol/L   CO2 24 20 - 29 mmol/L   Calcium 9.9 8.7 - 10.2 mg/dL   Total Protein 8.1 6.0 - 8.5 g/dL   Albumin 4.4 4.1 - 5.1 g/dL   Globulin, Total 3.7 1.5 - 4.5 g/dL   Albumin/Globulin Ratio 1.2 1.2 - 2.2   Bilirubin Total 0.3 0.0 - 1.2 mg/dL   Alkaline Phosphatase 84 44 - 121 IU/L   AST 24 0 - 40 IU/L   ALT 24 0 - 44 IU/L  Hemoglobin  A1c     Status: Abnormal   Collection Time: 09/22/22 11:36 AM  Result Value Ref Range   Hgb A1c MFr Bld 6.1 (H) 4.8 - 5.6 %    Comment:           Prediabetes: 5.7 - 6.4          Diabetes: >6.4          Glycemic control for adults with diabetes: <7.0    Est. average glucose Bld gHb Est-mCnc 128 mg/dL  Lipid panel     Status: Abnormal   Collection Time: 09/22/22 11:36 AM  Result Value Ref Range   Cholesterol, Total 249 (H) 100 - 199 mg/dL   Triglycerides 400 0 - 149 mg/dL   HDL 47 >86 mg/dL   VLDL Cholesterol Cal 18 5 - 40 mg/dL   LDL Chol Calc (NIH) 761 (H) 0 - 99 mg/dL   Chol/HDL Ratio 5.3 (H) 0.0 - 5.0 ratio    Comment:                                   T. Chol/HDL Ratio                                             Men  Women                               1/2 Avg.Risk  3.4    3.3                                   Avg.Risk  5.0    4.4                                2X Avg.Risk  9.6    7.1                                3X Avg.Risk 23.4   11.0   Hepatitis C antibody screen     Status: None   Collection Time: 09/22/22 11:36 AM  Result Value Ref Range   Hep C Virus Ab Non Reactive Non Reactive    Comment: HCV antibody alone does not differentiate between previously resolved infection and active infection. Equivocal and Reactive HCV antibody results should be followed up with an HCV RNA test to support the diagnosis of active HCV infection.   HIV antibody     Status: None   Collection Time: 09/22/22 11:36 AM  Result Value Ref Range   HIV Screen 4th Generation wRfx Non Reactive Non Reactive    Comment: HIV Negative HIV-1/HIV-2 antibodies and HIV-1 p24 antigen were NOT detected. There is no laboratory evidence of HIV infection.   Chlamydia/Gonococcus/Trichomonas, NAA     Status: Abnormal   Collection Time: 09/22/22  3:06 PM   Specimen: Urine   UC  Result Value Ref Range   Chlamydia by NAA Negative Negative   Gonococcus by NAA Negative Negative   Trich vag by NAA Positive (A) Negative  POCT rapid strep A     Status:  Normal   Collection Time: 12/18/22  3:47 PM  Result Value Ref Range   Rapid Strep A Screen  Negative Negative  POCT XPERT XPRESS SARS COVID-2/FLU/RSV     Status: Normal   Collection Time: 12/18/22  3:47 PM  Result Value Ref Range   SARS Coronavirus 2 negative    FLU A negative    FLU B negative    RSV RNA, PCR negative        Assessment & Plan:   Problem List Items Addressed This Visit       Active Problems   Mixed hyperlipidemia   Prediabetes   Vitamin D deficiency   Other Visit Diagnoses     Contact with and (suspected) exposure to covid-19    -  Primary   Relevant Orders   POCT XPERT XPRESS SARS COVID-2/FLU/RSV (Completed)   Pharyngitis, unspecified etiology       Relevant Orders   POCT rapid strep A (Completed)   POCT XPERT XPRESS SARS COVID-2/FLU/RSV (Completed)       Return in about 2 weeks (around 01/01/2023) for F/U.   Total time spent: 20 minutes  Miki Kins, FNP  12/18/2022   This document may have been prepared by [facility] Voice Recognition software and as such may include unintentional dictation errors.

## 2022-12-22 ENCOUNTER — Ambulatory Visit: Payer: 59 | Admitting: Family

## 2023-01-02 ENCOUNTER — Ambulatory Visit: Payer: 59 | Admitting: Family

## 2023-01-02 VITALS — BP 122/72 | HR 90 | Ht 67.0 in | Wt 208.4 lb

## 2023-01-02 DIAGNOSIS — E538 Deficiency of other specified B group vitamins: Secondary | ICD-10-CM | POA: Diagnosis not present

## 2023-01-02 DIAGNOSIS — E559 Vitamin D deficiency, unspecified: Secondary | ICD-10-CM | POA: Diagnosis not present

## 2023-01-02 DIAGNOSIS — E782 Mixed hyperlipidemia: Secondary | ICD-10-CM

## 2023-01-02 DIAGNOSIS — R7303 Prediabetes: Secondary | ICD-10-CM | POA: Diagnosis not present

## 2023-01-02 DIAGNOSIS — N529 Male erectile dysfunction, unspecified: Secondary | ICD-10-CM

## 2023-01-02 MED ORDER — SILDENAFIL CITRATE 100 MG PO TABS: 100.0000 mg | ORAL_TABLET | ORAL | 1 refills | Status: DC | PRN

## 2023-01-03 LAB — CMP14+EGFR
ALT: 37 IU/L (ref 0–44)
AST: 23 IU/L (ref 0–40)
Albumin/Globulin Ratio: 1.6 (ref 1.2–2.2)
Albumin: 4.4 g/dL (ref 4.1–5.1)
Alkaline Phosphatase: 88 IU/L (ref 44–121)
BUN/Creatinine Ratio: 10 (ref 9–20)
BUN: 10 mg/dL (ref 6–20)
Bilirubin Total: 0.3 mg/dL (ref 0.0–1.2)
CO2: 27 mmol/L (ref 20–29)
Calcium: 9.7 mg/dL (ref 8.7–10.2)
Chloride: 103 mmol/L (ref 96–106)
Creatinine, Ser: 0.98 mg/dL (ref 0.76–1.27)
Globulin, Total: 2.7 g/dL (ref 1.5–4.5)
Glucose: 91 mg/dL (ref 70–99)
Potassium: 4.3 mmol/L (ref 3.5–5.2)
Sodium: 141 mmol/L (ref 134–144)
Total Protein: 7.1 g/dL (ref 6.0–8.5)
eGFR: 102 mL/min/{1.73_m2} (ref >59)

## 2023-01-03 LAB — CBC WITH DIFFERENTIAL
Basophils Absolute: 0 10*3/uL (ref 0.0–0.2)
Basos: 1 %
EOS (ABSOLUTE): 0.2 10*3/uL (ref 0.0–0.4)
Eos: 4 %
Hematocrit: 43.9 % (ref 37.5–51.0)
Hemoglobin: 14.4 g/dL (ref 13.0–17.7)
Immature Grans (Abs): 0 10*3/uL (ref 0.0–0.1)
Immature Granulocytes: 0 %
Lymphocytes Absolute: 2.7 10*3/uL (ref 0.7–3.1)
Lymphs: 50 %
MCH: 29.9 pg (ref 26.6–33.0)
MCHC: 32.8 g/dL (ref 31.5–35.7)
MCV: 91 fL (ref 79–97)
Monocytes Absolute: 0.5 10*3/uL (ref 0.1–0.9)
Monocytes: 9 %
Neutrophils Absolute: 2 10*3/uL (ref 1.4–7.0)
Neutrophils: 36 %
RBC: 4.81 x10E6/uL (ref 4.14–5.80)
RDW: 13.3 % (ref 11.6–15.4)
WBC: 5.4 10*3/uL (ref 3.4–10.8)

## 2023-01-03 LAB — LIPID PANEL
Chol/HDL Ratio: 3.5 ratio (ref 0.0–5.0)
Cholesterol, Total: 181 mg/dL (ref 100–199)
HDL: 52 mg/dL (ref >39)
LDL Chol Calc (NIH): 111 mg/dL — ABNORMAL HIGH (ref 0–99)
Triglycerides: 101 mg/dL (ref 0–149)
VLDL Cholesterol Cal: 18 mg/dL (ref 5–40)

## 2023-01-03 LAB — VITAMIN D 25 HYDROXY (VIT D DEFICIENCY, FRACTURES): Vit D, 25-Hydroxy: 15.2 ng/mL — ABNORMAL LOW (ref 30.0–100.0)

## 2023-01-03 LAB — HEMOGLOBIN A1C
Est. average glucose Bld gHb Est-mCnc: 128 mg/dL
Hgb A1c MFr Bld: 6.1 % — ABNORMAL HIGH (ref 4.8–5.6)

## 2023-01-04 ENCOUNTER — Encounter: Payer: Self-pay | Admitting: Family

## 2023-01-04 DIAGNOSIS — N529 Male erectile dysfunction, unspecified: Secondary | ICD-10-CM | POA: Insufficient documentation

## 2023-01-04 NOTE — Assessment & Plan Note (Signed)
Checking labs today.  Continue current therapy for lipid control. Will modify as needed based on labwork results.  

## 2023-01-04 NOTE — Assessment & Plan Note (Signed)
Checking labs today.  Will continue supplements as needed.  

## 2023-01-04 NOTE — Progress Notes (Signed)
Established Patient Office Visit  Subjective:  Patient ID: Jeremy Curtis, male    DOB: Jan 16, 1986  Age: 37 y.o. MRN: [id number]  Chief Complaint  Patient presents with   Follow-up    3 month follow up    Patient is here today for his 3 months follow up.  He has been feeling well since last appointment, much better, and his previous URI has improved.   He does not have additional concerns to discuss today.  Labs are due today. He needs refills.   I have reviewed his active problem list, medication list, allergies, health maintenance, lab results for his appointment today.    No other concerns at this time.   Past Medical History:  Diagnosis Date   Mixed hyperlipidemia 12/18/2022   Prediabetes 12/18/2022   Vitamin D deficiency 12/18/2022    History reviewed. No pertinent surgical history.  Social History   Socioeconomic History   Marital status: Single    Spouse name: Not on file   Number of children: Not on file   Years of education: Not on file   Highest education level: Not on file  Occupational History   Not on file  Tobacco Use   Smoking status: Former    Types: Cigarettes   Smokeless tobacco: Never  Vaping Use   Vaping Use: Every day   Devices: fbar  Substance and Sexual Activity   Alcohol use: No   Drug use: No   Sexual activity: Not on file  Other Topics Concern   Not on file  Social History Narrative   Not on file   Social Determinants of Health   Financial Resource Strain: Not on file  Food Insecurity: Not on file  Transportation Needs: Not on file  Physical Activity: Not on file  Stress: Not on file  Social Connections: Not on file  Intimate Partner Violence: Not on file    Family History  Problem Relation Age of Onset   Hypertension Mother    Arthritis Mother     No Known Allergies  Review of Systems  All other systems reviewed and are negative.      Objective:   BP 122/72   Pulse 90   Ht 5\' 7"  (1.702 m)   Wt  208 lb 6.4 oz (94.5 kg)   SpO2 96%   BMI 32.64 kg/m   Vitals:   01/02/23 1510  BP: 122/72  Pulse: 90  Height: 5\' 7"  (1.702 m)  Weight: 208 lb 6.4 oz (94.5 kg)  SpO2: 96%  BMI (Calculated): 32.63    Physical Exam Vitals and nursing note reviewed.  Constitutional:      Appearance: Normal appearance. He is normal weight.  Eyes:     Pupils: Pupils are equal, round, and reactive to light.  Cardiovascular:     Rate and Rhythm: Normal rate and regular rhythm.     Pulses: Normal pulses.     Heart sounds: Normal heart sounds.  Pulmonary:     Effort: Pulmonary effort is normal.     Breath sounds: Normal breath sounds.  Neurological:     Mental Status: He is alert.  Psychiatric:        Mood and Affect: Mood normal.        Behavior: Behavior normal.        Thought Content: Thought content normal.        Judgment: Judgment normal.      Results for orders placed or performed in visit on 01/02/23  Lipid panel  Result Value Ref Range   Cholesterol, Total 181 100 - 199 mg/dL   Triglycerides 161 0 - 149 mg/dL   HDL 52 >09 mg/dL   VLDL Cholesterol Cal 18 5 - 40 mg/dL   LDL Chol Calc (NIH) 604 (H) 0 - 99 mg/dL   Chol/HDL Ratio 3.5 0.0 - 5.0 ratio  VITAMIN D 25 Hydroxy (Vit-D Deficiency, Fractures)  Result Value Ref Range   Vit D, 25-Hydroxy 15.2 (L) 30.0 - 100.0 ng/mL  CBC With Differential  Result Value Ref Range   WBC 5.4 3.4 - 10.8 x10E3/uL   RBC 4.81 4.14 - 5.80 x10E6/uL   Hemoglobin 14.4 13.0 - 17.7 g/dL   Hematocrit 54.0 98.1 - 51.0 %   MCV 91 79 - 97 fL   MCH 29.9 26.6 - 33.0 pg   MCHC 32.8 31.5 - 35.7 g/dL   RDW 19.1 47.8 - 29.5 %   Neutrophils 36 Not Estab. %   Lymphs 50 Not Estab. %   Monocytes 9 Not Estab. %   Eos 4 Not Estab. %   Basos 1 Not Estab. %   Neutrophils Absolute 2.0 1.4 - 7.0 x10E3/uL   Lymphocytes Absolute 2.7 0.7 - 3.1 x10E3/uL   Monocytes Absolute 0.5 0.1 - 0.9 x10E3/uL   EOS (ABSOLUTE) 0.2 0.0 - 0.4 x10E3/uL   Basophils Absolute 0.0 0.0 -  0.2 x10E3/uL   Immature Granulocytes 0 Not Estab. %   Immature Grans (Abs) 0.0 0.0 - 0.1 x10E3/uL  CMP14+EGFR  Result Value Ref Range   Glucose 91 70 - 99 mg/dL   BUN 10 6 - 20 mg/dL   Creatinine, Ser 6.21 0.76 - 1.27 mg/dL   eGFR 308 >65 HQ/ION/6.29   BUN/Creatinine Ratio 10 9 - 20   Sodium 141 134 - 144 mmol/L   Potassium 4.3 3.5 - 5.2 mmol/L   Chloride 103 96 - 106 mmol/L   CO2 27 20 - 29 mmol/L   Calcium 9.7 8.7 - 10.2 mg/dL   Total Protein 7.1 6.0 - 8.5 g/dL   Albumin 4.4 4.1 - 5.1 g/dL   Globulin, Total 2.7 1.5 - 4.5 g/dL   Albumin/Globulin Ratio 1.6 1.2 - 2.2   Bilirubin Total 0.3 0.0 - 1.2 mg/dL   Alkaline Phosphatase 88 44 - 121 IU/L   AST 23 0 - 40 IU/L   ALT 37 0 - 44 IU/L  Hemoglobin A1c  Result Value Ref Range   Hgb A1c MFr Bld 6.1 (H) 4.8 - 5.6 %   Est. average glucose Bld gHb Est-mCnc 128 mg/dL    Recent Results (from the past 2160 hour(s))  POCT rapid strep A     Status: Normal   Collection Time: 12/18/22  3:47 PM  Result Value Ref Range   Rapid Strep A Screen Negative Negative  POCT XPERT XPRESS SARS COVID-2/FLU/RSV     Status: Normal   Collection Time: 12/18/22  3:47 PM  Result Value Ref Range   SARS Coronavirus 2 negative    FLU A negative    FLU B negative    RSV RNA, PCR negative   Lipid panel     Status: Abnormal   Collection Time: 01/02/23  3:52 PM  Result Value Ref Range   Cholesterol, Total 181 100 - 199 mg/dL   Triglycerides 528 0 - 149 mg/dL   HDL 52 >41 mg/dL   VLDL Cholesterol Cal 18 5 - 40 mg/dL   LDL Chol Calc (NIH) 324 (H) 0 - 99  mg/dL   Chol/HDL Ratio 3.5 0.0 - 5.0 ratio    Comment:                                   T. Chol/HDL Ratio                                             Men  Women                               1/2 Avg.Risk  3.4    3.3                                   Avg.Risk  5.0    4.4                                2X Avg.Risk  9.6    7.1                                3X Avg.Risk 23.4   11.0   VITAMIN D 25 Hydroxy  (Vit-D Deficiency, Fractures)     Status: Abnormal   Collection Time: 01/02/23  3:52 PM  Result Value Ref Range   Vit D, 25-Hydroxy 15.2 (L) 30.0 - 100.0 ng/mL    Comment: Vitamin D deficiency has been defined by the Institute of Medicine and an Endocrine Society practice guideline as a level of serum 25-OH vitamin D less than 20 ng/mL (1,2). The Endocrine Society went on to further define vitamin D insufficiency as a level between 21 and 29 ng/mL (2). 1. IOM (Institute of Medicine). 2010. Dietary reference    intakes for calcium and D. [place]: The    [employer]. 2. Holick MF, Binkley , Bischoff-Ferrari HA, et al.    Evaluation, treatment, and prevention of vitamin D    deficiency: an Endocrine Society clinical practice    guideline. JCEM. 2011 Jul; 96(7):1911-30.   CBC With Differential     Status: None   Collection Time: 01/02/23  3:52 PM  Result Value Ref Range   WBC 5.4 3.4 - 10.8 x10E3/uL   RBC 4.81 4.14 - 5.80 x10E6/uL   Hemoglobin 14.4 13.0 - 17.7 g/dL   Hematocrit 54.0 98.1 - 51.0 %   MCV 91 79 - 97 fL   MCH 29.9 26.6 - 33.0 pg   MCHC 32.8 31.5 - 35.7 g/dL   RDW 19.1 47.8 - 29.5 %   Neutrophils 36 Not Estab. %   Lymphs 50 Not Estab. %   Monocytes 9 Not Estab. %   Eos 4 Not Estab. %   Basos 1 Not Estab. %   Neutrophils Absolute 2.0 1.4 - 7.0 x10E3/uL   Lymphocytes Absolute 2.7 0.7 - 3.1 x10E3/uL   Monocytes Absolute 0.5 0.1 - 0.9 x10E3/uL   EOS (ABSOLUTE) 0.2 0.0 - 0.4 x10E3/uL   Basophils Absolute 0.0 0.0 - 0.2 x10E3/uL   Immature Granulocytes 0 Not Estab. %   Immature Grans (Abs) 0.0 0.0 - 0.1 x10E3/uL  CMP14+EGFR  Status: None   Collection Time: 01/02/23  3:52 PM  Result Value Ref Range   Glucose 91 70 - 99 mg/dL   BUN 10 6 - 20 mg/dL   Creatinine, Ser 9.14 0.76 - 1.27 mg/dL   eGFR 782 >95 AO/ZHY/8.65   BUN/Creatinine Ratio 10 9 - 20   Sodium 141 134 - 144 mmol/L   Potassium 4.3 3.5 - 5.2 mmol/L   Chloride 103 96 - 106 mmol/L    CO2 27 20 - 29 mmol/L   Calcium 9.7 8.7 - 10.2 mg/dL   Total Protein 7.1 6.0 - 8.5 g/dL   Albumin 4.4 4.1 - 5.1 g/dL   Globulin, Total 2.7 1.5 - 4.5 g/dL   Albumin/Globulin Ratio 1.6 1.2 - 2.2   Bilirubin Total 0.3 0.0 - 1.2 mg/dL   Alkaline Phosphatase 88 44 - 121 IU/L   AST 23 0 - 40 IU/L   ALT 37 0 - 44 IU/L  Hemoglobin A1c     Status: Abnormal   Collection Time: 01/02/23  3:52 PM  Result Value Ref Range   Hgb A1c MFr Bld 6.1 (H) 4.8 - 5.6 %    Comment:          Prediabetes: 5.7 - 6.4          Diabetes: >6.4          Glycemic control for adults with diabetes: <7.0    Est. average glucose Bld gHb Est-mCnc 128 mg/dL       Assessment & Plan:   Problem List Items Addressed This Visit       Active Problems   Mixed hyperlipidemia - Primary    Checking labs today.  Continue current therapy for lipid control. Will modify as needed based on labwork results.       Relevant Medications   sildenafil (VIAGRA) 100 MG tablet   Other Relevant Orders   Lipid panel (Completed)   CBC With Differential (Completed)   CMP14+EGFR (Completed)   Prediabetes    Patient educated on foods that contain carbohydrates and the need to decrease intake.  We discussed prediabetes, and what it means and the need for strict dietary control to prevent progression to type 2 diabetes.  Advised to decrease intake of sugary drinks, including sodas, sweet tea, and some juices, and of starch and sugar heavy foods (ie., potatoes, rice, bread, pasta, desserts). He verbalizes understanding and agreement with the changes discussed today.       Relevant Orders   CBC With Differential (Completed)   CMP14+EGFR (Completed)   Hemoglobin A1c (Completed)   Vitamin D deficiency    Checking labs today.  Will continue supplements as needed.       Relevant Orders   VITAMIN D 25 Hydroxy (Vit-D Deficiency, Fractures) (Completed)   CBC With Differential (Completed)   CMP14+EGFR (Completed)   Male erectile disorder     Patient requests refill for sildenafil.  Will send to pharmacy today.       Relevant Medications   sildenafil (VIAGRA) 100 MG tablet   Other Visit Diagnoses     B12 deficiency due to diet       Relevant Orders   CBC With Differential (Completed)   CMP14+EGFR (Completed)       Return in about 3 months (around 04/04/2023).   Total time spent: 20 minutes  Miki Kins, FNP  01/02/2023   This document may have been prepared by [facility] Voice Recognition software and as such may include unintentional dictation  errors.

## 2023-01-04 NOTE — Assessment & Plan Note (Signed)
Patient requests refill for sildenafil.  Will send to pharmacy today.

## 2023-01-04 NOTE — Assessment & Plan Note (Signed)
Patient educated on foods that contain carbohydrates and the need to decrease intake.  We discussed prediabetes, and what it means and the need for strict dietary control to prevent progression to type 2 diabetes.  Advised to decrease intake of sugary drinks, including sodas, sweet tea, and some juices, and of starch and sugar heavy foods (ie., potatoes, rice, bread, pasta, desserts). He verbalizes understanding and agreement with the changes discussed today.   

## 2023-02-20 ENCOUNTER — Other Ambulatory Visit: Payer: Self-pay | Admitting: Family

## 2023-02-20 MED ORDER — ROSUVASTATIN CALCIUM 10 MG PO TABS: 10.0000 mg | ORAL_TABLET | Freq: Every day | ORAL | 3 refills | Status: DC

## 2023-04-03 ENCOUNTER — Ambulatory Visit: Payer: 59 | Admitting: Family

## 2023-04-03 ENCOUNTER — Encounter: Payer: Self-pay | Admitting: Family

## 2023-04-03 VITALS — BP 137/71 | HR 88 | Ht 66.0 in | Wt 207.2 lb

## 2023-04-03 DIAGNOSIS — E559 Vitamin D deficiency, unspecified: Secondary | ICD-10-CM

## 2023-04-03 DIAGNOSIS — E782 Mixed hyperlipidemia: Secondary | ICD-10-CM | POA: Diagnosis not present

## 2023-04-03 DIAGNOSIS — R7303 Prediabetes: Secondary | ICD-10-CM | POA: Diagnosis not present

## 2023-04-03 DIAGNOSIS — E538 Deficiency of other specified B group vitamins: Secondary | ICD-10-CM

## 2023-04-03 DIAGNOSIS — N529 Male erectile dysfunction, unspecified: Secondary | ICD-10-CM | POA: Diagnosis not present

## 2023-04-03 MED ORDER — SILDENAFIL CITRATE 100 MG PO TABS: 100.0000 mg | ORAL_TABLET | ORAL | 1 refills | Status: DC | PRN

## 2023-04-03 MED ORDER — ROSUVASTATIN CALCIUM 10 MG PO TABS: 10.0000 mg | ORAL_TABLET | Freq: Every day | ORAL | 3 refills | Status: DC

## 2023-04-04 LAB — LIPID PANEL
Chol/HDL Ratio: 3.8 ratio (ref 0.0–5.0)
Cholesterol, Total: 184 mg/dL (ref 100–199)
HDL: 49 mg/dL (ref >39)
LDL Chol Calc (NIH): 117 mg/dL — ABNORMAL HIGH (ref 0–99)
Triglycerides: 100 mg/dL (ref 0–149)
VLDL Cholesterol Cal: 18 mg/dL (ref 5–40)

## 2023-04-04 LAB — CMP14+EGFR
ALT: 29 IU/L (ref 0–44)
AST: 25 IU/L (ref 0–40)
Albumin: 4.3 g/dL (ref 4.1–5.1)
Alkaline Phosphatase: 97 IU/L (ref 44–121)
BUN/Creatinine Ratio: 11 (ref 9–20)
BUN: 10 mg/dL (ref 6–20)
Bilirubin Total: 0.2 mg/dL (ref 0.0–1.2)
CO2: 24 mmol/L (ref 20–29)
Calcium: 9.5 mg/dL (ref 8.7–10.2)
Chloride: 101 mmol/L (ref 96–106)
Creatinine, Ser: 0.91 mg/dL (ref 0.76–1.27)
Globulin, Total: 3.1 g/dL (ref 1.5–4.5)
Glucose: 58 mg/dL — ABNORMAL LOW (ref 70–99)
Potassium: 3.5 mmol/L (ref 3.5–5.2)
Sodium: 143 mmol/L (ref 134–144)
Total Protein: 7.4 g/dL (ref 6.0–8.5)
eGFR: 111 mL/min/{1.73_m2} (ref >59)

## 2023-04-04 LAB — HEMOGLOBIN A1C
Est. average glucose Bld gHb Est-mCnc: 128 mg/dL
Hgb A1c MFr Bld: 6.1 % — ABNORMAL HIGH (ref 4.8–5.6)

## 2023-04-04 LAB — VITAMIN B12: Vitamin B-12: 321 pg/mL (ref 232–1245)

## 2023-04-04 LAB — VITAMIN D 25 HYDROXY (VIT D DEFICIENCY, FRACTURES): Vit D, 25-Hydroxy: 17.3 ng/mL — ABNORMAL LOW (ref 30.0–100.0)

## 2023-04-05 ENCOUNTER — Encounter: Payer: Self-pay | Admitting: Family

## 2023-04-05 NOTE — Progress Notes (Signed)
Established Patient Office Visit  Subjective:  Patient ID: Jeremy Curtis, male    DOB: 1986/07/10  Age: 37 y.o. MRN: [id number]  Chief Complaint  Patient presents with   Follow-up    Patient is here today for his 3 months follow up.  He has been feeling fairly well since last appointment.   He does not have additional concerns to discuss today.  Labs are due today. He needs refills.   I have reviewed his active problem list, medication list, allergies, notes from last encounter, lab results for his appointment today.    No other concerns at this time.   Past Medical History:  Diagnosis Date   Mixed hyperlipidemia 12/18/2022   Prediabetes 12/18/2022   Vitamin D deficiency 12/18/2022    History reviewed. No pertinent surgical history.  Social History   Socioeconomic History   Marital status: Single    Spouse name: Not on file   Number of children: Not on file   Years of education: Not on file   Highest education level: Not on file  Occupational History   Not on file  Tobacco Use   Smoking status: Former    Types: Cigarettes   Smokeless tobacco: Never  Vaping Use   Vaping status: Every Day   Devices: fbar  Substance and Sexual Activity   Alcohol use: No   Drug use: No   Sexual activity: Not on file  Other Topics Concern   Not on file  Social History Narrative   Not on file   Social Determinants of Health   Financial Resource Strain: Not on file  Food Insecurity: Not on file  Transportation Needs: Not on file  Physical Activity: Not on file  Stress: Not on file  Social Connections: Not on file  Intimate Partner Violence: Not on file    Family History  Problem Relation Age of Onset   Hypertension Mother    Arthritis Mother     No Known Allergies  Review of Systems  All other systems reviewed and are negative.      Objective:   BP 137/71   Pulse 88   Ht 5\' 6"  (1.676 m)   Wt 207 lb 3.2 oz (94 kg)   SpO2 99%   BMI 33.44 kg/m    Vitals:   04/03/23 1443  BP: 137/71  Pulse: 88  Height: 5\' 6"  (1.676 m)  Weight: 207 lb 3.2 oz (94 kg)  SpO2: 99%  BMI (Calculated): 33.46    Physical Exam Vitals and nursing note reviewed.  Constitutional:      Appearance: Normal appearance. He is normal weight.  Eyes:     Extraocular Movements: Extraocular movements intact.     Conjunctiva/sclera: Conjunctivae normal.     Pupils: Pupils are equal, round, and reactive to light.  Cardiovascular:     Rate and Rhythm: Normal rate and regular rhythm.     Pulses: Normal pulses.     Heart sounds: Normal heart sounds.  Pulmonary:     Effort: Pulmonary effort is normal.     Breath sounds: Normal breath sounds.  Musculoskeletal:     Cervical back: Normal range of motion.  Neurological:     General: No focal deficit present.     Mental Status: He is alert and oriented to person, place, and time. Mental status is at baseline.  Psychiatric:        Mood and Affect: Mood normal.        Behavior: Behavior normal.  Thought Content: Thought content normal.        Judgment: Judgment normal.      Results for orders placed or performed in visit on 04/03/23  Lipid panel  Result Value Ref Range   Cholesterol, Total 184 100 - 199 mg/dL   Triglycerides 132 0 - 149 mg/dL   HDL 49 >44 mg/dL   VLDL Cholesterol Cal 18 5 - 40 mg/dL   LDL Chol Calc (NIH) 010 (H) 0 - 99 mg/dL   Chol/HDL Ratio 3.8 0.0 - 5.0 ratio  VITAMIN D 25 Hydroxy (Vit-D Deficiency, Fractures)  Result Value Ref Range   Vit D, 25-Hydroxy 17.3 (L) 30.0 - 100.0 ng/mL  CMP14+EGFR  Result Value Ref Range   Glucose 58 (L) 70 - 99 mg/dL   BUN 10 6 - 20 mg/dL   Creatinine, Ser 2.72 0.76 - 1.27 mg/dL   eGFR 536 >64 QI/HKV/4.25   BUN/Creatinine Ratio 11 9 - 20   Sodium 143 134 - 144 mmol/L   Potassium 3.5 3.5 - 5.2 mmol/L   Chloride 101 96 - 106 mmol/L   CO2 24 20 - 29 mmol/L   Calcium 9.5 8.7 - 10.2 mg/dL   Total Protein 7.4 6.0 - 8.5 g/dL   Albumin 4.3 4.1 -  5.1 g/dL   Globulin, Total 3.1 1.5 - 4.5 g/dL   Bilirubin Total 0.2 0.0 - 1.2 mg/dL   Alkaline Phosphatase 97 44 - 121 IU/L   AST 25 0 - 40 IU/L   ALT 29 0 - 44 IU/L  Hemoglobin A1c  Result Value Ref Range   Hgb A1c MFr Bld 6.1 (H) 4.8 - 5.6 %   Est. average glucose Bld gHb Est-mCnc 128 mg/dL  Vitamin Z56  Result Value Ref Range   Vitamin B-12 321 232 - 1,245 pg/mL    Recent Results (from the past 2160 hour(s))  Lipid panel     Status: Abnormal   Collection Time: 04/03/23  3:02 PM  Result Value Ref Range   Cholesterol, Total 184 100 - 199 mg/dL   Triglycerides 387 0 - 149 mg/dL   HDL 49 >56 mg/dL   VLDL Cholesterol Cal 18 5 - 40 mg/dL   LDL Chol Calc (NIH) 433 (H) 0 - 99 mg/dL   Chol/HDL Ratio 3.8 0.0 - 5.0 ratio    Comment:                                   T. Chol/HDL Ratio                                             Men  Women                               1/2 Avg.Risk  3.4    3.3                                   Avg.Risk  5.0    4.4                                2X Avg.Risk  9.6  7.1                                3X Avg.Risk 23.4   11.0   VITAMIN D 25 Hydroxy (Vit-D Deficiency, Fractures)     Status: Abnormal   Collection Time: 04/03/23  3:02 PM  Result Value Ref Range   Vit D, 25-Hydroxy 17.3 (L) 30.0 - 100.0 ng/mL    Comment: Vitamin D deficiency has been defined by the Institute of Medicine and an Endocrine Society practice guideline as a level of serum 25-OH vitamin D less than 20 ng/mL (1,2). The Endocrine Society went on to further define vitamin D insufficiency as a level between 21 and 29 ng/mL (2). 1. IOM (Institute of Medicine). 2010. Dietary reference    intakes for calcium and D. [place]: The    [employer]. 2. Holick MF, Binkley [place], Bischoff-Ferrari HA, et al.    Evaluation, treatment, and prevention of vitamin D    deficiency: an Endocrine Society clinical practice    guideline. JCEM. 2011 Jul; 96(7):1911-30.   CMP14+EGFR      Status: Abnormal   Collection Time: 04/03/23  3:02 PM  Result Value Ref Range   Glucose 58 (L) 70 - 99 mg/dL   BUN 10 6 - 20 mg/dL   Creatinine, Ser 6.04 0.76 - 1.27 mg/dL   eGFR 540 >98 JX/BJY/7.82   BUN/Creatinine Ratio 11 9 - 20   Sodium 143 134 - 144 mmol/L   Potassium 3.5 3.5 - 5.2 mmol/L   Chloride 101 96 - 106 mmol/L   CO2 24 20 - 29 mmol/L   Calcium 9.5 8.7 - 10.2 mg/dL   Total Protein 7.4 6.0 - 8.5 g/dL   Albumin 4.3 4.1 - 5.1 g/dL   Globulin, Total 3.1 1.5 - 4.5 g/dL   Bilirubin Total 0.2 0.0 - 1.2 mg/dL   Alkaline Phosphatase 97 44 - 121 IU/L   AST 25 0 - 40 IU/L   ALT 29 0 - 44 IU/L  Hemoglobin A1c     Status: Abnormal   Collection Time: 04/03/23  3:02 PM  Result Value Ref Range   Hgb A1c MFr Bld 6.1 (H) 4.8 - 5.6 %    Comment:          Prediabetes: 5.7 - 6.4          Diabetes: >6.4          Glycemic control for adults with diabetes: <7.0    Est. average glucose Bld gHb Est-mCnc 128 mg/dL  Vitamin N56     Status: None   Collection Time: 04/03/23  3:02 PM  Result Value Ref Range   Vitamin B-12 321 232 - 1,245 pg/mL       Assessment & Plan:   Problem List Items Addressed This Visit       Active Problems   Mixed hyperlipidemia - Primary    Checking labs today.  Continue current therapy for lipid control. Will modify as needed based on labwork results.       Relevant Medications   rosuvastatin (CRESTOR) 10 MG tablet   sildenafil (VIAGRA) 100 MG tablet   Other Relevant Orders   Lipid panel (Completed)   CMP14+EGFR (Completed)   CBC with Differential/Platelet   Prediabetes    A1C Continues to be in prediabetic ranges.  Will reassess at follow up after next lab check.  Patient counseled on dietary choices and verbalized understanding.  Patient educated on foods that contain carbohydrates and the need to decrease intake.  We discussed prediabetes, and what it means and the need for strict dietary control to prevent progression to type 2 diabetes.   Advised to decrease intake of sugary drinks, including sodas, sweet tea, and some juices, and of starch and sugar heavy foods (ie., potatoes, rice, bread, pasta, desserts). He verbalizes understanding and agreement with the changes discussed today.        Relevant Orders   CMP14+EGFR (Completed)   Hemoglobin A1c (Completed)   CBC with Differential/Platelet   Vitamin D deficiency    Checking labs today.  Will continue supplements as needed.       Relevant Orders   VITAMIN D 25 Hydroxy (Vit-D Deficiency, Fractures) (Completed)   CMP14+EGFR (Completed)   CBC with Differential/Platelet   Male erectile disorder    Patient stable.  Well controlled with current therapy.   Continue current meds.       Relevant Medications   sildenafil (VIAGRA) 100 MG tablet   Other Relevant Orders   CMP14+EGFR (Completed)   CBC with Differential/Platelet   Other Visit Diagnoses     B12 deficiency due to diet       Checking labs today.  Will continue supplements as needed.   Relevant Orders   CMP14+EGFR (Completed)   Vitamin B12 (Completed)       Return in about 4 months (around 08/03/2023).   Total time spent: 20 minutes  Miki Kins, FNP  04/03/2023   This document may have been prepared by [facility] Voice Recognition software and as such may include unintentional dictation errors.

## 2023-04-05 NOTE — Assessment & Plan Note (Signed)
Checking labs today.  Will continue supplements as needed.  

## 2023-04-05 NOTE — Assessment & Plan Note (Signed)
Checking labs today.  Continue current therapy for lipid control. Will modify as needed based on labwork results.  

## 2023-04-05 NOTE — Assessment & Plan Note (Signed)
A1C Continues to be in prediabetic ranges.  Will reassess at follow up after next lab check.  Patient counseled on dietary choices and verbalized understanding.  Patient educated on foods that contain carbohydrates and the need to decrease intake.  We discussed prediabetes, and what it means and the need for strict dietary control to prevent progression to type 2 diabetes.  Advised to decrease intake of sugary drinks, including sodas, sweet tea, and some juices, and of starch and sugar heavy foods (ie., potatoes, rice, bread, pasta, desserts). He verbalizes understanding and agreement with the changes discussed today.  

## 2023-04-05 NOTE — Assessment & Plan Note (Signed)
Patient stable.  Well controlled with current therapy.   Continue current meds.  

## 2023-08-03 ENCOUNTER — Encounter: Payer: Self-pay | Admitting: Family

## 2023-08-03 ENCOUNTER — Ambulatory Visit: Payer: 59 | Admitting: Family

## 2023-08-03 VITALS — BP 130/82 | HR 105 | Ht 66.0 in | Wt 214.4 lb

## 2023-08-03 DIAGNOSIS — E782 Mixed hyperlipidemia: Secondary | ICD-10-CM

## 2023-08-03 DIAGNOSIS — E538 Deficiency of other specified B group vitamins: Secondary | ICD-10-CM | POA: Diagnosis not present

## 2023-08-03 DIAGNOSIS — R7303 Prediabetes: Secondary | ICD-10-CM

## 2023-08-03 DIAGNOSIS — E559 Vitamin D deficiency, unspecified: Secondary | ICD-10-CM

## 2023-08-04 LAB — CBC WITH DIFFERENTIAL/PLATELET
Basophils Absolute: 0.1 10*3/uL (ref 0.0–0.2)
Basos: 1 %
EOS (ABSOLUTE): 0.4 10*3/uL (ref 0.0–0.4)
Eos: 7 %
Hematocrit: 45.5 % (ref 37.5–51.0)
Hemoglobin: 14.7 g/dL (ref 13.0–17.7)
Immature Grans (Abs): 0 10*3/uL (ref 0.0–0.1)
Immature Granulocytes: 0 %
Lymphocytes Absolute: 2.5 10*3/uL (ref 0.7–3.1)
Lymphs: 50 %
MCH: 29.8 pg (ref 26.6–33.0)
MCHC: 32.3 g/dL (ref 31.5–35.7)
MCV: 92 fL (ref 79–97)
Monocytes Absolute: 0.5 10*3/uL (ref 0.1–0.9)
Monocytes: 10 %
Neutrophils Absolute: 1.7 10*3/uL (ref 1.4–7.0)
Neutrophils: 32 %
Platelets: 347 10*3/uL (ref 150–450)
RBC: 4.94 x10E6/uL (ref 4.14–5.80)
RDW: 13.6 % (ref 11.6–15.4)
WBC: 5.1 10*3/uL (ref 3.4–10.8)

## 2023-08-04 LAB — CMP14+EGFR
ALT: 26 [IU]/L (ref 0–44)
AST: 23 [IU]/L (ref 0–40)
Albumin: 4.8 g/dL (ref 4.1–5.1)
Alkaline Phosphatase: 103 [IU]/L (ref 44–121)
BUN/Creatinine Ratio: 8 — ABNORMAL LOW (ref 9–20)
BUN: 8 mg/dL (ref 6–20)
Bilirubin Total: 0.3 mg/dL (ref 0.0–1.2)
CO2: 26 mmol/L (ref 20–29)
Calcium: 9.8 mg/dL (ref 8.7–10.2)
Chloride: 101 mmol/L (ref 96–106)
Creatinine, Ser: 0.96 mg/dL (ref 0.76–1.27)
Globulin, Total: 2.5 g/dL (ref 1.5–4.5)
Glucose: 88 mg/dL (ref 70–99)
Potassium: 4.1 mmol/L (ref 3.5–5.2)
Sodium: 139 mmol/L (ref 134–144)
Total Protein: 7.3 g/dL (ref 6.0–8.5)
eGFR: 104 mL/min/{1.73_m2} (ref >59)

## 2023-08-04 LAB — LIPID PANEL
Chol/HDL Ratio: 4 {ratio} (ref 0.0–5.0)
Cholesterol, Total: 202 mg/dL — ABNORMAL HIGH (ref 100–199)
HDL: 50 mg/dL (ref >39)
LDL Chol Calc (NIH): 130 mg/dL — ABNORMAL HIGH (ref 0–99)
Triglycerides: 124 mg/dL (ref 0–149)
VLDL Cholesterol Cal: 22 mg/dL (ref 5–40)

## 2023-08-04 LAB — VITAMIN B12: Vitamin B-12: 374 pg/mL (ref 232–1245)

## 2023-08-04 LAB — HEMOGLOBIN A1C
Est. average glucose Bld gHb Est-mCnc: 128 mg/dL
Hgb A1c MFr Bld: 6.1 % — ABNORMAL HIGH (ref 4.8–5.6)

## 2023-08-04 LAB — VITAMIN D 25 HYDROXY (VIT D DEFICIENCY, FRACTURES): Vit D, 25-Hydroxy: 9.5 ng/mL — ABNORMAL LOW (ref 30.0–100.0)

## 2023-08-04 NOTE — Assessment & Plan Note (Signed)
 Checking labs today.  Will continue supplements as needed.

## 2023-08-04 NOTE — Assessment & Plan Note (Signed)
 A1C Continues to be in prediabetic ranges.  Will reassess at follow up after next lab check.  Patient counseled on dietary choices and verbalized understanding.  Patient educated on foods that contain carbohydrates and the need to decrease intake.  We discussed prediabetes, and what it means and the need for strict dietary control to prevent progression to type 2 diabetes.  Advised to decrease intake of sugary drinks, including sodas, sweet tea, and some juices, and of starch and sugar heavy foods (ie., potatoes, rice, bread, pasta, desserts). He verbalizes understanding and agreement with the changes discussed today.

## 2023-08-04 NOTE — Assessment & Plan Note (Signed)
 Checking labs today.  Continue current therapy for lipid control. Will modify as needed based on labwork results.

## 2023-08-04 NOTE — Progress Notes (Signed)
Established Patient Office Visit  Subjective:  Patient ID: Jeremy Curtis, male    DOB: 03-02-1986  Age: 37 y.o. MRN: [id number]  Chief Complaint  Patient presents with   Follow-up    4 month follow up    Patient is here today for his 4 months follow up.  He has been feeling well since last appointment.   He does not have additional concerns to discuss today.  He says that he has been taking his medications as prescribed, when he was missing days previously.   Labs are due today. He needs refills.   I have reviewed his active problem list, medication list, allergies, health maintenance, notes from last encounter, lab results for his appointment today.      No other concerns at this time.   Past Medical History:  Diagnosis Date   Mixed hyperlipidemia 12/18/2022   Prediabetes 12/18/2022   Vitamin D deficiency 12/18/2022    No past surgical history on file.  Social History   Socioeconomic History   Marital status: Single    Spouse name: Not on file   Number of children: Not on file   Years of education: Not on file   Highest education level: Not on file  Occupational History   Not on file  Tobacco Use   Smoking status: Former    Types: Cigarettes   Smokeless tobacco: Never  Vaping Use   Vaping status: Every Day   Devices: fbar  Substance and Sexual Activity   Alcohol use: No   Drug use: No   Sexual activity: Not on file  Other Topics Concern   Not on file  Social History Narrative   Not on file   Social Drivers of Health   Financial Resource Strain: Not on file  Food Insecurity: Not on file  Transportation Needs: Not on file  Physical Activity: Not on file  Stress: Not on file  Social Connections: Not on file  Intimate Partner Violence: Not on file    Family History  Problem Relation Age of Onset   Hypertension Mother    Arthritis Mother     No Known Allergies  Review of Systems  All other systems reviewed and are negative.       Objective:   BP 130/82   Pulse (!) 105   Ht 5\' 6"  (1.676 m)   Wt 214 lb 6.4 oz (97.3 kg)   SpO2 96%   BMI 34.61 kg/m   Vitals:   08/03/23 1255  BP: 130/82  Pulse: (!) 105  Height: 5\' 6"  (1.676 m)  Weight: 214 lb 6.4 oz (97.3 kg)  SpO2: 96%  BMI (Calculated): 34.62    Physical Exam Vitals and nursing note reviewed.  Constitutional:      Appearance: Normal appearance. He is normal weight.  HENT:     Head: Normocephalic and atraumatic.  Eyes:     Extraocular Movements: Extraocular movements intact.     Conjunctiva/sclera: Conjunctivae normal.     Pupils: Pupils are equal, round, and reactive to light.  Cardiovascular:     Rate and Rhythm: Normal rate and regular rhythm.     Pulses: Normal pulses.     Heart sounds: Normal heart sounds.  Pulmonary:     Effort: Pulmonary effort is normal.     Breath sounds: Normal breath sounds.  Musculoskeletal:        General: Normal range of motion.     Cervical back: Normal range of motion.  Neurological:  General: No focal deficit present.     Mental Status: He is alert and oriented to person, place, and time.  Psychiatric:        Mood and Affect: Mood normal.        Behavior: Behavior normal.        Thought Content: Thought content normal.        Judgment: Judgment normal.      Results for orders placed or performed in visit on 08/03/23  Lipid panel  Result Value Ref Range   Cholesterol, Total 202 (H) 100 - 199 mg/dL   Triglycerides 034 0 - 149 mg/dL   HDL 50 >74 mg/dL   VLDL Cholesterol Cal 22 5 - 40 mg/dL   LDL Chol Calc (NIH) 259 (H) 0 - 99 mg/dL   Chol/HDL Ratio 4.0 0.0 - 5.0 ratio  VITAMIN D 25 Hydroxy (Vit-D Deficiency, Fractures)  Result Value Ref Range   Vit D, 25-Hydroxy 9.5 (L) 30.0 - 100.0 ng/mL  CMP14+EGFR  Result Value Ref Range   Glucose 88 70 - 99 mg/dL   BUN 8 6 - 20 mg/dL   Creatinine, Ser 5.63 0.76 - 1.27 mg/dL   eGFR 875 >64 PP/IRJ/1.88   BUN/Creatinine Ratio 8 (L) 9 - 20   Sodium 139  134 - 144 mmol/L   Potassium 4.1 3.5 - 5.2 mmol/L   Chloride 101 96 - 106 mmol/L   CO2 26 20 - 29 mmol/L   Calcium 9.8 8.7 - 10.2 mg/dL   Total Protein 7.3 6.0 - 8.5 g/dL   Albumin 4.8 4.1 - 5.1 g/dL   Globulin, Total 2.5 1.5 - 4.5 g/dL   Bilirubin Total 0.3 0.0 - 1.2 mg/dL   Alkaline Phosphatase 103 44 - 121 IU/L   AST 23 0 - 40 IU/L   ALT 26 0 - 44 IU/L  Hemoglobin A1c  Result Value Ref Range   Hgb A1c MFr Bld 6.1 (H) 4.8 - 5.6 %   Est. average glucose Bld gHb Est-mCnc 128 mg/dL  Vitamin C16  Result Value Ref Range   Vitamin B-12 374 232 - 1,245 pg/mL  CBC with Diff  Result Value Ref Range   WBC 5.1 3.4 - 10.8 x10E3/uL   RBC 4.94 4.14 - 5.80 x10E6/uL   Hemoglobin 14.7 13.0 - 17.7 g/dL   Hematocrit 60.6 30.1 - 51.0 %   MCV 92 79 - 97 fL   MCH 29.8 26.6 - 33.0 pg   MCHC 32.3 31.5 - 35.7 g/dL   RDW 60.1 09.3 - 23.5 %   Platelets 347 150 - 450 x10E3/uL   Neutrophils 32 Not Estab. %   Lymphs 50 Not Estab. %   Monocytes 10 Not Estab. %   Eos 7 Not Estab. %   Basos 1 Not Estab. %   Neutrophils Absolute 1.7 1.4 - 7.0 x10E3/uL   Lymphocytes Absolute 2.5 0.7 - 3.1 x10E3/uL   Monocytes Absolute 0.5 0.1 - 0.9 x10E3/uL   EOS (ABSOLUTE) 0.4 0.0 - 0.4 x10E3/uL   Basophils Absolute 0.1 0.0 - 0.2 x10E3/uL   Immature Granulocytes 0 Not Estab. %   Immature Grans (Abs) 0.0 0.0 - 0.1 x10E3/uL    Recent Results (from the past 2160 hours)  Lipid panel     Status: Abnormal   Collection Time: 08/03/23  1:33 PM  Result Value Ref Range   Cholesterol, Total 202 (H) 100 - 199 mg/dL   Triglycerides 573 0 - 149 mg/dL   HDL 50 >22 mg/dL  VLDL Cholesterol Cal 22 5 - 40 mg/dL   LDL Chol Calc (NIH) 409 (H) 0 - 99 mg/dL   Chol/HDL Ratio 4.0 0.0 - 5.0 ratio    Comment:                                   T. Chol/HDL Ratio                                             Men  Women                               1/2 Avg.Risk  3.4    3.3                                   Avg.Risk  5.0    4.4                                 2X Avg.Risk  9.6    7.1                                3X Avg.Risk 23.4   11.0   VITAMIN D 25 Hydroxy (Vit-D Deficiency, Fractures)     Status: Abnormal   Collection Time: 08/03/23  1:33 PM  Result Value Ref Range   Vit D, 25-Hydroxy 9.5 (L) 30.0 - 100.0 ng/mL    Comment: Vitamin D deficiency has been defined by the Institute of Medicine and an Endocrine Society practice guideline as a level of serum 25-OH vitamin D less than 20 ng/mL (1,2). The Endocrine Society went on to further define vitamin D insufficiency as a level between 21 and 29 ng/mL (2). 1. IOM (Institute of Medicine). 2010. Dietary reference    intakes for calcium and D. [place]: The    [employer]. 2. Holick MF, Binkley [place], Bischoff-Ferrari HA, et al.    Evaluation, treatment, and prevention of vitamin D    deficiency: an Endocrine Society clinical practice    guideline. JCEM. 2011 Jul; 96(7):1911-30.   CMP14+EGFR     Status: Abnormal   Collection Time: 08/03/23  1:33 PM  Result Value Ref Range   Glucose 88 70 - 99 mg/dL   BUN 8 6 - 20 mg/dL   Creatinine, Ser 8.11 0.76 - 1.27 mg/dL   eGFR 914 >78 GN/FAO/1.30   BUN/Creatinine Ratio 8 (L) 9 - 20   Sodium 139 134 - 144 mmol/L   Potassium 4.1 3.5 - 5.2 mmol/L   Chloride 101 96 - 106 mmol/L   CO2 26 20 - 29 mmol/L   Calcium 9.8 8.7 - 10.2 mg/dL   Total Protein 7.3 6.0 - 8.5 g/dL   Albumin 4.8 4.1 - 5.1 g/dL   Globulin, Total 2.5 1.5 - 4.5 g/dL   Bilirubin Total 0.3 0.0 - 1.2 mg/dL   Alkaline Phosphatase 103 44 - 121 IU/L   AST 23 0 - 40 IU/L   ALT 26 0 - 44 IU/L  Hemoglobin A1c     Status: Abnormal  Collection Time: 08/03/23  1:33 PM  Result Value Ref Range   Hgb A1c MFr Bld 6.1 (H) 4.8 - 5.6 %    Comment:          Prediabetes: 5.7 - 6.4          Diabetes: >6.4          Glycemic control for adults with diabetes: <7.0    Est. average glucose Bld gHb Est-mCnc 128 mg/dL  Vitamin M57     Status: None   Collection Time:  08/03/23  1:33 PM  Result Value Ref Range   Vitamin B-12 374 232 - 1,245 pg/mL  CBC with Diff     Status: None   Collection Time: 08/03/23  1:33 PM  Result Value Ref Range   WBC 5.1 3.4 - 10.8 x10E3/uL   RBC 4.94 4.14 - 5.80 x10E6/uL   Hemoglobin 14.7 13.0 - 17.7 g/dL   Hematocrit 84.6 96.2 - 51.0 %   MCV 92 79 - 97 fL   MCH 29.8 26.6 - 33.0 pg   MCHC 32.3 31.5 - 35.7 g/dL   RDW 95.2 84.1 - 32.4 %   Platelets 347 150 - 450 x10E3/uL   Neutrophils 32 Not Estab. %   Lymphs 50 Not Estab. %   Monocytes 10 Not Estab. %   Eos 7 Not Estab. %   Basos 1 Not Estab. %   Neutrophils Absolute 1.7 1.4 - 7.0 x10E3/uL   Lymphocytes Absolute 2.5 0.7 - 3.1 x10E3/uL   Monocytes Absolute 0.5 0.1 - 0.9 x10E3/uL   EOS (ABSOLUTE) 0.4 0.0 - 0.4 x10E3/uL   Basophils Absolute 0.1 0.0 - 0.2 x10E3/uL   Immature Granulocytes 0 Not Estab. %   Immature Grans (Abs) 0.0 0.0 - 0.1 x10E3/uL       Assessment & Plan:   Problem List Items Addressed This Visit       Other   Mixed hyperlipidemia   Checking labs today.  Continue current therapy for lipid control. Will modify as needed based on labwork results.       Relevant Orders   Lipid panel (Completed)   CMP14+EGFR (Completed)   CBC with Diff (Completed)   Prediabetes - Primary   A1C Continues to be in prediabetic ranges.  Will reassess at follow up after next lab check.  Patient counseled on dietary choices and verbalized understanding.  Patient educated on foods that contain carbohydrates and the need to decrease intake.  We discussed prediabetes, and what it means and the need for strict dietary control to prevent progression to type 2 diabetes.  Advised to decrease intake of sugary drinks, including sodas, sweet tea, and some juices, and of starch and sugar heavy foods (ie., potatoes, rice, bread, pasta, desserts). He verbalizes understanding and agreement with the changes discussed today.        Relevant Orders   CMP14+EGFR (Completed)    Hemoglobin A1c (Completed)   CBC with Diff (Completed)   Vitamin D deficiency   Checking labs today.  Will continue supplements as needed.       Relevant Orders   VITAMIN D 25 Hydroxy (Vit-D Deficiency, Fractures) (Completed)   Other Visit Diagnoses       B12 deficiency due to diet       Checking labs today.  Will continue supplements as needed.   Relevant Orders   CMP14+EGFR (Completed)   Vitamin B12 (Completed)   CBC with Diff (Completed)       Return in about 4  months (around 12/02/2023).   Total time spent: 20 minutes  Miki Kins, FNP  08/03/2023   This document may have been prepared by [facility] Voice Recognition software and as such may include unintentional dictation errors.

## 2023-08-26 ENCOUNTER — Ambulatory Visit (INDEPENDENT_AMBULATORY_CARE_PROVIDER_SITE_OTHER): Payer: 59 | Admitting: Family

## 2023-08-26 DIAGNOSIS — R6889 Other general symptoms and signs: Secondary | ICD-10-CM

## 2023-08-26 DIAGNOSIS — J069 Acute upper respiratory infection, unspecified: Secondary | ICD-10-CM | POA: Diagnosis not present

## 2023-08-26 LAB — POCT XPERT XPRESS SARS COVID-2/FLU/RSV
FLU A: POSITIVE
FLU B: NEGATIVE
RSV RNA, PCR: NEGATIVE
SARS Coronavirus 2: NEGATIVE

## 2023-08-26 MED ORDER — OSELTAMIVIR PHOSPHATE 75 MG PO CAPS: 75.0000 mg | ORAL_CAPSULE | Freq: Two times a day (BID) | ORAL | 0 refills | Status: AC

## 2023-11-24 ENCOUNTER — Ambulatory Visit: Admitting: Cardiology

## 2023-11-24 ENCOUNTER — Encounter: Payer: Self-pay | Admitting: Cardiology

## 2023-11-24 VITALS — BP 122/88 | HR 103 | Ht 66.0 in | Wt 202.0 lb

## 2023-11-24 DIAGNOSIS — R6889 Other general symptoms and signs: Secondary | ICD-10-CM | POA: Insufficient documentation

## 2023-11-24 DIAGNOSIS — Z0130 Encounter for examination of blood pressure without abnormal findings: Secondary | ICD-10-CM

## 2023-11-24 DIAGNOSIS — J301 Allergic rhinitis due to pollen: Secondary | ICD-10-CM | POA: Insufficient documentation

## 2023-11-24 LAB — POCT RAPID INFLUENZA A&B
FLU A: NEGATIVE
FLU B: NEGATIVE

## 2023-11-24 NOTE — Progress Notes (Signed)
 Established Patient Office Visit  Subjective:  Patient ID: Jeremy Curtis, male    DOB: [birth year]  Age: 38 y.o. MRN: [id number]  Chief Complaint  Patient presents with   Acute Visit    Fever, Flu Like Symptoms, Negative at Home Covid, Sore Throat    Patient in office for an acute visit. Patient complaining of flu like symptoms, sore throat, PND, rhinorrhea. Negative at home Covid test per patient. Fever 100.8 yesterday, normal today. Negative Flu swab in office today. Taking Mucinex with some relief. Recommend taking an antihistamine, Claritin-D, and nasal spray.   Sore Throat  This is a new problem. The current episode started in the past 7 days. The problem has been waxing and waning. The pain is worse on the right side. The maximum temperature recorded prior to his arrival was 100.4 - 100.9 F. Associated symptoms include headaches. Pertinent negatives include no abdominal pain, congestion, coughing, diarrhea, ear pain, shortness of breath or trouble swallowing. Treatments tried: Mucinex. The treatment provided mild relief.    No other concerns at this time.   Past Medical History:  Diagnosis Date   Mixed hyperlipidemia 12/18/2022   Prediabetes 12/18/2022   Vitamin D  deficiency 12/18/2022    History reviewed. No pertinent surgical history.  Social History   Socioeconomic History   Marital status: Single    Spouse name: Not on file   Number of children: Not on file   Years of education: Not on file   Highest education level: Not on file  Occupational History   Not on file  Tobacco Use   Smoking status: Former    Types: Cigarettes   Smokeless tobacco: Never  Vaping Use   Vaping status: Every Day   Devices: fbar  Substance and Sexual Activity   Alcohol use: No   Drug use: No   Sexual activity: Not on file  Other Topics Concern   Not on file  Social History Narrative   Not on file   Social Drivers of Health   Financial Resource Strain: Not on file  Food  Insecurity: Not on file  Transportation Needs: Not on file  Physical Activity: Not on file  Stress: Not on file  Social Connections: Not on file  Intimate Partner Violence: Not on file    Family History  Problem Relation Age of Onset   Hypertension Mother    Arthritis Mother     No Known Allergies  Outpatient Medications Prior to Visit  Medication Sig   rosuvastatin  (CRESTOR ) 10 MG tablet Take 1 tablet (10 mg total) by mouth daily.   sildenafil  (VIAGRA ) 100 MG tablet Take 1 tablet (100 mg total) by mouth as needed for erectile dysfunction.   No facility-administered medications prior to visit.    Review of Systems  Constitutional: Negative.   HENT:  Positive for sore throat. Negative for congestion, ear pain, sinus pain and trouble swallowing.   Eyes: Negative.   Respiratory: Negative.  Negative for cough and shortness of breath.   Cardiovascular: Negative.  Negative for chest pain.  Gastrointestinal: Negative.  Negative for abdominal pain, constipation and diarrhea.  Genitourinary: Negative.   Musculoskeletal:  Negative for joint pain and myalgias.  Skin: Negative.   Neurological:  Positive for headaches. Negative for dizziness.  Endo/Heme/Allergies: Negative.   All other systems reviewed and are negative.      Objective:   BP 122/88   Pulse (!) 103   Ht 5\' 6"  (1.676 m)   Wt 202 lb (91.6  kg)   SpO2 98%   BMI 32.60 kg/m   Vitals:   11/24/23 1059  BP: 122/88  Pulse: (!) 103  Height: 5\' 6"  (1.676 m)  Weight: 202 lb (91.6 kg)  SpO2: 98%  BMI (Calculated): 32.62    Physical Exam Nursing note reviewed.  Constitutional:      Appearance: Normal appearance. He is normal weight.  HENT:     Head: Normocephalic and atraumatic.     Nose: Nose normal.     Mouth/Throat:     Mouth: Mucous membranes are moist.     Pharynx: Oropharynx is clear.  Eyes:     Extraocular Movements: Extraocular movements intact.     Conjunctiva/sclera: Conjunctivae normal.      Pupils: Pupils are equal, round, and reactive to light.  Cardiovascular:     Rate and Rhythm: Normal rate and regular rhythm.     Pulses: Normal pulses.     Heart sounds: Normal heart sounds.  Pulmonary:     Effort: Pulmonary effort is normal.     Breath sounds: Normal breath sounds.  Abdominal:     General: Abdomen is flat. Bowel sounds are normal.     Palpations: Abdomen is soft.  Musculoskeletal:        General: Normal range of motion.     Cervical back: Normal range of motion.  Skin:    General: Skin is warm and dry.  Neurological:     General: No focal deficit present.     Mental Status: He is alert and oriented to person, place, and time.  Psychiatric:        Mood and Affect: Mood normal.        Behavior: Behavior normal.        Thought Content: Thought content normal.        Judgment: Judgment normal.      Results for orders placed or performed in visit on 11/24/23  POCT Rapid Influenza A&B  Result Value Ref Range   FLU A Negative    FLU B Negative     Recent Results (from the past 2160 hours)  POCT XPERT XPRESS SARS COVID-2/FLU/RSV [[id number]]     Status: None   Collection Time: 08/26/23  3:21 PM  Result Value Ref Range   SARS Coronavirus 2 Negative    FLU A Positive    FLU B Negative    RSV RNA, PCR Negative   POCT Rapid Influenza A&B     Status: Normal   Collection Time: 11/24/23 11:06 AM  Result Value Ref Range   FLU A Negative    FLU B Negative       Assessment & Plan:  Claritin-D Nasal spray  Problem List Items Addressed This Visit       Respiratory   Seasonal allergic rhinitis due to pollen - Primary     Other   Flu-like symptoms   Relevant Orders   POCT Rapid Influenza A&B (Completed)    Return if symptoms worsen or fail to improve.   Total time spent: 25 minutes  [employer], NP  11/24/2023   This document may have been prepared by Dragon Voice Recognition software and as such may include unintentional dictation errors.

## 2023-11-30 ENCOUNTER — Ambulatory Visit: Payer: 59 | Admitting: Family

## 2023-12-04 ENCOUNTER — Encounter: Payer: Self-pay | Admitting: Family

## 2023-12-04 ENCOUNTER — Ambulatory Visit: Admitting: Family

## 2023-12-04 DIAGNOSIS — Z0130 Encounter for examination of blood pressure without abnormal findings: Secondary | ICD-10-CM

## 2023-12-04 DIAGNOSIS — E538 Deficiency of other specified B group vitamins: Secondary | ICD-10-CM | POA: Diagnosis not present

## 2023-12-04 DIAGNOSIS — E782 Mixed hyperlipidemia: Secondary | ICD-10-CM | POA: Diagnosis not present

## 2023-12-04 DIAGNOSIS — R7303 Prediabetes: Secondary | ICD-10-CM | POA: Diagnosis not present

## 2023-12-04 DIAGNOSIS — N529 Male erectile dysfunction, unspecified: Secondary | ICD-10-CM

## 2023-12-04 DIAGNOSIS — E559 Vitamin D deficiency, unspecified: Secondary | ICD-10-CM | POA: Diagnosis not present

## 2023-12-04 MED ORDER — VIAGRA 100 MG PO TABS: 100.0000 mg | ORAL_TABLET | ORAL | 1 refills | Status: AC | PRN

## 2023-12-04 MED ORDER — ROSUVASTATIN CALCIUM 20 MG PO TABS: 20.0000 mg | ORAL_TABLET | Freq: Every day | ORAL | 1 refills | Status: AC

## 2023-12-04 NOTE — Progress Notes (Signed)
 Established Patient Office Visit  Subjective:  Patient ID: Jeremy Curtis, male    DOB: Sep 25, 1985  Age: 38 y.o. MRN: [id number]  Chief Complaint  Patient presents with   Follow-up    4 month follow up    Patient is here today for his 4 months follow up.  He has been feeling fairly well since last appointment.   He does have additional concerns to discuss today.  Never got the increased RX for the crestor , needs 20mg .  Also asks if we can send the brand name Viagra  for him instead.   Labs are due today. He needs refills.   I have reviewed his active problem list, medication list, allergies, notes from last encounter, lab results for his appointment today.      No other concerns at this time.   Past Medical History:  Diagnosis Date   Mixed hyperlipidemia 12/18/2022   Prediabetes 12/18/2022   Vitamin D  deficiency 12/18/2022    No past surgical history on file.  Social History   Socioeconomic History   Marital status: Single    Spouse name: Not on file   Number of children: Not on file   Years of education: Not on file   Highest education level: Not on file  Occupational History   Not on file  Tobacco Use   Smoking status: Former    Types: Cigarettes   Smokeless tobacco: Never  Vaping Use   Vaping status: Every Day   Devices: fbar  Substance and Sexual Activity   Alcohol use: No   Drug use: No   Sexual activity: Not on file  Other Topics Concern   Not on file  Social History Narrative   Not on file   Social Drivers of Health   Financial Resource Strain: Not on file  Food Insecurity: Not on file  Transportation Needs: Not on file  Physical Activity: Not on file  Stress: Not on file  Social Connections: Not on file  Intimate Partner Violence: Not on file    Family History  Problem Relation Age of Onset   Hypertension Mother    Arthritis Mother     No Known Allergies  Review of Systems  All other systems reviewed and are  negative.      Objective:   BP 126/80   Pulse (!) 104   Ht 5' 6 (1.676 m)   Wt 210 lb 6.4 oz (95.4 kg)   SpO2 97%   BMI 33.96 kg/m   Vitals:   12/04/23 0920  BP: 126/80  Pulse: (!) 104  Height: 5' 6 (1.676 m)  Weight: 210 lb 6.4 oz (95.4 kg)  SpO2: 97%  BMI (Calculated): 33.98    Physical Exam Vitals and nursing note reviewed.  Constitutional:      Appearance: Normal appearance. He is normal weight.  Eyes:     Pupils: Pupils are equal, round, and reactive to light.  Cardiovascular:     Rate and Rhythm: Normal rate and regular rhythm.     Pulses: Normal pulses.     Heart sounds: Normal heart sounds.  Pulmonary:     Effort: Pulmonary effort is normal.     Breath sounds: Normal breath sounds.  Neurological:     Mental Status: He is alert.  Psychiatric:        Mood and Affect: Mood normal.        Behavior: Behavior normal.      No results found for any visits on 12/04/23.  Recent Results (from the past 2160 hours)  POCT Rapid Influenza A&B     Status: Normal   Collection Time: 11/24/23 11:06 AM  Result Value Ref Range   FLU A Negative    FLU B Negative         Assessment & Plan Prediabetes A1C Continues to be in prediabetic ranges.  Will reassess at follow up after next lab check.  Patient counseled on dietary choices and verbalized understanding.   -CBC w/Diff -CMP w/eGFR -Hemoglobin A1C Mixed hyperlipidemia Checking labs today.  Continue current therapy for lipid control. Will modify as needed based on labwork results.   -CMP w/eGFR -Lipid Panel  Meds ordered this encounter  Medications   rosuvastatin  (CRESTOR ) 20 MG tablet    Sig: Take 1 tablet (20 mg total) by mouth daily.    Dispense:  90 tablet    Refill:  1   B12 deficiency due to diet Vitamin D  deficiency Checking labs today.  Will continue supplements as needed.   - Vitamin D  - Vitamin B12 - TSH  Male erectile disorder Meds ordered this encounter  Medications    VIAGRA  100 MG tablet    Sig: Take 1 tablet (100 mg total) by mouth as needed for erectile dysfunction.    Dispense:  20 tablet    Refill:  1      Return in about 4 months (around 04/05/2024) for F/U.   Total time spent: 20 minutes  ALAN CHRISTELLA ARRANT, FNP  12/04/2023   This document may have been prepared by [facility] Voice Recognition software and as such may include unintentional dictation errors.

## 2023-12-05 LAB — CBC WITH DIFFERENTIAL/PLATELET
Basophils Absolute: 0.1 10*3/uL (ref 0.0–0.2)
Basos: 1 %
EOS (ABSOLUTE): 0.1 10*3/uL (ref 0.0–0.4)
Eos: 2 %
Hematocrit: 41.8 % (ref 37.5–51.0)
Hemoglobin: 13.7 g/dL (ref 13.0–17.7)
Immature Grans (Abs): 0 10*3/uL (ref 0.0–0.1)
Immature Granulocytes: 0 %
Lymphocytes Absolute: 3.2 10*3/uL — ABNORMAL HIGH (ref 0.7–3.1)
Lymphs: 44 %
MCH: 30.2 pg (ref 26.6–33.0)
MCHC: 32.8 g/dL (ref 31.5–35.7)
MCV: 92 fL (ref 79–97)
Monocytes Absolute: 0.6 10*3/uL (ref 0.1–0.9)
Monocytes: 8 %
Neutrophils Absolute: 3.2 10*3/uL (ref 1.4–7.0)
Neutrophils: 45 %
Platelets: 393 10*3/uL (ref 150–450)
RBC: 4.53 x10E6/uL (ref 4.14–5.80)
RDW: 13.6 % (ref 11.6–15.4)
WBC: 7.2 10*3/uL (ref 3.4–10.8)

## 2023-12-05 LAB — VITAMIN B12: Vitamin B-12: 295 pg/mL (ref 232–1245)

## 2023-12-05 LAB — CMP14+EGFR
ALT: 31 IU/L (ref 0–44)
AST: 19 IU/L (ref 0–40)
Albumin: 4.4 g/dL (ref 4.1–5.1)
Alkaline Phosphatase: 103 IU/L (ref 44–121)
BUN/Creatinine Ratio: 12 (ref 9–20)
BUN: 12 mg/dL (ref 6–20)
Bilirubin Total: <0.2 mg/dL (ref 0.0–1.2)
CO2: 24 mmol/L (ref 20–29)
Calcium: 9.6 mg/dL (ref 8.7–10.2)
Chloride: 103 mmol/L (ref 96–106)
Creatinine, Ser: 1.01 mg/dL (ref 0.76–1.27)
Globulin, Total: 2.9 g/dL (ref 1.5–4.5)
Glucose: 122 mg/dL — ABNORMAL HIGH (ref 70–99)
Potassium: 4 mmol/L (ref 3.5–5.2)
Sodium: 141 mmol/L (ref 134–144)
Total Protein: 7.3 g/dL (ref 6.0–8.5)
eGFR: 98 mL/min/{1.73_m2} (ref >59)

## 2023-12-05 LAB — LIPID PANEL
Chol/HDL Ratio: 3.4 ratio (ref 0.0–5.0)
Cholesterol, Total: 160 mg/dL (ref 100–199)
HDL: 47 mg/dL (ref >39)
LDL Chol Calc (NIH): 94 mg/dL (ref 0–99)
Triglycerides: 101 mg/dL (ref 0–149)
VLDL Cholesterol Cal: 19 mg/dL (ref 5–40)

## 2023-12-05 LAB — HEMOGLOBIN A1C
Est. average glucose Bld gHb Est-mCnc: 131 mg/dL
Hgb A1c MFr Bld: 6.2 % — ABNORMAL HIGH (ref 4.8–5.6)

## 2023-12-05 LAB — VITAMIN D 25 HYDROXY (VIT D DEFICIENCY, FRACTURES): Vit D, 25-Hydroxy: 11.6 ng/mL — ABNORMAL LOW (ref 30.0–100.0)

## 2024-01-24 ENCOUNTER — Encounter: Payer: Self-pay | Admitting: Family

## 2024-01-24 NOTE — Assessment & Plan Note (Signed)
 A1C Continues to be in prediabetic ranges.  Will reassess at follow up after next lab check.  Patient counseled on dietary choices and verbalized understanding.   -CBC w/Diff -CMP w/eGFR -Hemoglobin A1C

## 2024-01-24 NOTE — Assessment & Plan Note (Signed)
 Checking labs today.  Will continue supplements as needed.   - Vitamin D  - Vitamin B12 - TSH

## 2024-01-24 NOTE — Assessment & Plan Note (Signed)
 Checking labs today.  Continue current therapy for lipid control. Will modify as needed based on labwork results.   -CMP w/eGFR -Lipid Panel  Meds ordered this encounter  Medications   rosuvastatin  (CRESTOR ) 20 MG tablet    Sig: Take 1 tablet (20 mg total) by mouth daily.    Dispense:  90 tablet    Refill:  1

## 2024-01-24 NOTE — Assessment & Plan Note (Signed)
 Meds ordered this encounter  Medications   VIAGRA  100 MG tablet    Sig: Take 1 tablet (100 mg total) by mouth as needed for erectile dysfunction.    Dispense:  20 tablet    Refill:  1

## 2024-04-05 ENCOUNTER — Ambulatory Visit: Admitting: Family

## 2024-08-17 ENCOUNTER — Ambulatory Visit: Admitting: Family

## 2024-08-17 ENCOUNTER — Encounter: Payer: Self-pay | Admitting: Family

## 2024-08-17 VITALS — BP 128/90 | HR 103 | Ht 67.0 in | Wt 225.4 lb

## 2024-08-17 DIAGNOSIS — E782 Mixed hyperlipidemia: Secondary | ICD-10-CM

## 2024-08-17 DIAGNOSIS — E559 Vitamin D deficiency, unspecified: Secondary | ICD-10-CM | POA: Diagnosis not present

## 2024-08-17 DIAGNOSIS — Z6835 Body mass index (BMI) 35.0-35.9, adult: Secondary | ICD-10-CM | POA: Diagnosis not present

## 2024-08-17 DIAGNOSIS — E538 Deficiency of other specified B group vitamins: Secondary | ICD-10-CM | POA: Diagnosis not present

## 2024-08-17 DIAGNOSIS — Z0130 Encounter for examination of blood pressure without abnormal findings: Secondary | ICD-10-CM

## 2024-08-17 DIAGNOSIS — R7303 Prediabetes: Secondary | ICD-10-CM | POA: Diagnosis not present

## 2024-08-17 NOTE — Assessment & Plan Note (Signed)
-   Reinforced healthy diet and exercise as tolerated. - Continue medications as prescribed. - Check labs today

## 2024-08-17 NOTE — Assessment & Plan Note (Signed)
-   Check labs today - Supplementation recommended based off lab results and will notify patient at that time

## 2024-08-17 NOTE — Progress Notes (Signed)
 "  Established Patient Office Visit  Subjective:  Patient ID: Jeremy Curtis, male    DOB: 1986/05/05  Age: 39 y.o. MRN: [id number]  Chief Complaint  Patient presents with   Follow-up    6 month follow up    Patient is here today for his 6 months follow up.  He has been feeling well since last appointment.   He does not have additional concerns to discuss today. His BP is slightly elevated today. He reports just getting off work and states he did eat fast food today. He has known family hx of hypertension. Reinforced slow sodium diet and exercising regularly. Discussed future need for BP medication if it remains elevated at future visits.  Labs are due today, he is not fasting but would like his blood work completed while he is here today. Patient reports no longer working 3rd shift and reports his fatigue has improved significantly and is interested in what his Vitamin D  levels are now that he is outside in the sun more.  He does not need refills.   I have reviewed his active problem list, medication list, allergies, family history, social history, health maintenance, notes from last encounter, lab results for his appointment today.      No other concerns at this time.   Past Medical History:  Diagnosis Date   Mixed hyperlipidemia 12/18/2022   Prediabetes 12/18/2022   Vitamin D  deficiency 12/18/2022    History reviewed. No pertinent surgical history.  Social History   Socioeconomic History   Marital status: Single    Spouse name: Not on file   Number of children: Not on file   Years of education: Not on file   Highest education level: Not on file  Occupational History   Not on file  Tobacco Use   Smoking status: Former    Types: Cigarettes   Smokeless tobacco: Never  Vaping Use   Vaping status: Every Day   Devices: fbar  Substance and Sexual Activity   Alcohol use: No   Drug use: No   Sexual activity: Not on file  Other Topics Concern   Not on file   Social History Narrative   Not on file   Social Drivers of Health   Tobacco Use: Medium Risk (08/17/2024)   Patient History    Smoking Tobacco Use: Former    Smokeless Tobacco Use: Never    Passive Exposure: Not on Actuary Strain: Not on file  Food Insecurity: Not on file  Transportation Needs: Not on file  Physical Activity: Not on file  Stress: Not on file  Social Connections: Not on file  Intimate Partner Violence: Not on file  Depression (PHQ2-9): Low Risk (12/04/2023)   Depression (PHQ2-9)    PHQ-2 Score: 2  Alcohol Screen: Not on file  Housing: Not on file  Utilities: Not on file  Health Literacy: Not on file    Family History  Problem Relation Age of Onset   Hypertension Mother    Arthritis Mother     Allergies[1]  Review of Systems  Constitutional:  Negative for malaise/fatigue.  HENT: Negative.    Eyes:  Negative for blurred vision and pain.  Respiratory:  Negative for cough and shortness of breath.   Cardiovascular:  Negative for chest pain, palpitations, claudication and leg swelling.  Gastrointestinal:  Negative for abdominal pain, blood in stool, constipation, diarrhea, nausea and vomiting.  Genitourinary:  Negative for dysuria, frequency and urgency.  Musculoskeletal: Negative.   Skin:  Negative.   Neurological:  Negative for dizziness, tingling, sensory change and headaches.  Endo/Heme/Allergies: Negative.   Psychiatric/Behavioral: Negative.         Objective:   BP (!) 128/90   Pulse (!) 103   Ht 5' 7 (1.702 m)   Wt 225 lb 6.4 oz (102.2 kg)   SpO2 94%   BMI 35.30 kg/m   Vitals:   08/17/24 1500 08/17/24 1512  BP: (!) 132/92 (!) 128/90  Pulse: (!) 103   Height: 5' 7 (1.702 m)   Weight: 225 lb 6.4 oz (102.2 kg)   SpO2: 94%   BMI (Calculated): 35.29     Physical Exam Vitals and nursing note reviewed.  Constitutional:      Appearance: Normal appearance.  HENT:     Head: Normocephalic.  Eyes:     Extraocular  Movements: Extraocular movements intact.     Pupils: Pupils are equal, round, and reactive to light.  Cardiovascular:     Rate and Rhythm: Normal rate and regular rhythm.     Pulses: Normal pulses.     Heart sounds: Normal heart sounds. No murmur heard. Pulmonary:     Effort: Pulmonary effort is normal. No respiratory distress.     Breath sounds: Normal breath sounds.  Abdominal:     General: There is no distension.     Tenderness: There is no abdominal tenderness.  Musculoskeletal:        General: No tenderness. Normal range of motion.     Cervical back: Normal range of motion and neck supple.     Right lower leg: No edema.     Left lower leg: No edema.  Skin:    General: Skin is warm and dry.     Coloration: Skin is not jaundiced.     Findings: No erythema.  Neurological:     General: No focal deficit present.     Mental Status: He is alert and oriented to person, place, and time.  Psychiatric:        Mood and Affect: Mood normal.        Speech: Speech normal.        Behavior: Behavior is cooperative.        Cognition and Memory: Memory is not impaired.      No results found for any visits on 08/17/24.  No results found for this or any previous visit (from the past 2160 hours).     Assessment & Plan:   Assessment & Plan Severe obesity with body mass index (BMI) of 35.0 to 35.9 and comorbidity (HCC) Prediabetes Mixed hyperlipidemia - Reinforced healthy diet and exercise as tolerated. - Continue medications as prescribed. - Check labs today  B12 deficiency due to diet Vitamin D  deficiency - Check labs today - Supplementation recommended based off lab results and will notify patient at that time     Return in about 6 months (around 02/14/2025).   Total time spent: 30 minutes  Oddis DELENA Cain, FNP  08/17/2024   This document may have been prepared by [facility] Voice Recognition software and as such may include unintentional dictation errors.     [1] No  Known Allergies  "

## 2024-08-18 ENCOUNTER — Ambulatory Visit: Payer: Self-pay

## 2024-08-18 LAB — CBC WITH DIFFERENTIAL/PLATELET
Basophils Absolute: 0 x10E3/uL (ref 0.0–0.2)
Basos: 1 %
EOS (ABSOLUTE): 0.3 x10E3/uL (ref 0.0–0.4)
Eos: 4 %
Hematocrit: 45.3 % (ref 37.5–51.0)
Hemoglobin: 14.7 g/dL (ref 13.0–17.7)
Immature Grans (Abs): 0 x10E3/uL (ref 0.0–0.1)
Immature Granulocytes: 0 %
Lymphocytes Absolute: 3.3 x10E3/uL — ABNORMAL HIGH (ref 0.7–3.1)
Lymphs: 49 %
MCH: 30.8 pg (ref 26.6–33.0)
MCHC: 32.5 g/dL (ref 31.5–35.7)
MCV: 95 fL (ref 79–97)
Monocytes Absolute: 0.6 x10E3/uL (ref 0.1–0.9)
Monocytes: 9 %
Neutrophils Absolute: 2.6 x10E3/uL (ref 1.4–7.0)
Neutrophils: 37 %
Platelets: 418 x10E3/uL (ref 150–450)
RBC: 4.78 x10E6/uL (ref 4.14–5.80)
RDW: 14 % (ref 11.6–15.4)
WBC: 6.8 x10E3/uL (ref 3.4–10.8)

## 2024-08-18 LAB — LIPID PANEL
Chol/HDL Ratio: 4.3 ratio (ref 0.0–5.0)
Cholesterol, Total: 194 mg/dL (ref 100–199)
HDL: 45 mg/dL
LDL Chol Calc (NIH): 122 mg/dL — ABNORMAL HIGH (ref 0–99)
Triglycerides: 150 mg/dL — ABNORMAL HIGH (ref 0–149)
VLDL Cholesterol Cal: 27 mg/dL (ref 5–40)

## 2024-08-18 LAB — CMP14+EGFR
ALT: 26 IU/L (ref 0–44)
AST: 21 IU/L (ref 0–40)
Albumin: 4.9 g/dL (ref 4.1–5.1)
Alkaline Phosphatase: 87 IU/L (ref 47–123)
BUN/Creatinine Ratio: 13 (ref 9–20)
BUN: 12 mg/dL (ref 6–20)
Bilirubin Total: <0.2 mg/dL (ref 0.0–1.2)
CO2: 24 mmol/L (ref 20–29)
Calcium: 9.7 mg/dL (ref 8.7–10.2)
Chloride: 102 mmol/L (ref 96–106)
Creatinine, Ser: 0.93 mg/dL (ref 0.76–1.27)
Globulin, Total: 2.6 g/dL (ref 1.5–4.5)
Glucose: 100 mg/dL — ABNORMAL HIGH (ref 70–99)
Potassium: 4.2 mmol/L (ref 3.5–5.2)
Sodium: 140 mmol/L (ref 134–144)
Total Protein: 7.5 g/dL (ref 6.0–8.5)
eGFR: 108 mL/min/1.73

## 2024-08-18 LAB — HEMOGLOBIN A1C
Est. average glucose Bld gHb Est-mCnc: 128 mg/dL
Hgb A1c MFr Bld: 6.1 % — ABNORMAL HIGH (ref 4.8–5.6)

## 2024-08-18 LAB — VITAMIN B12: Vitamin B-12: 311 pg/mL (ref 232–1245)

## 2024-08-18 LAB — VITAMIN D 25 HYDROXY (VIT D DEFICIENCY, FRACTURES): Vit D, 25-Hydroxy: 9.1 ng/mL — ABNORMAL LOW (ref 30.0–100.0)

## 2024-08-18 MED ORDER — VITAMIN D (ERGOCALCIFEROL) 1.25 MG (50000 UNIT) PO CAPS: 50000.0000 [IU] | ORAL_CAPSULE | ORAL | 1 refills | Status: AC

## 2024-08-18 NOTE — Addendum Note (Signed)
 Addended by: ADINE NIPPER A on: 08/18/2024 01:10 PM   Modules accepted: Orders

## 2024-08-19 NOTE — Progress Notes (Signed)
 Patient notified

## 2025-02-14 ENCOUNTER — Ambulatory Visit: Admitting: Family
